# Patient Record
Sex: Female | Born: 1980 | Race: Black or African American | Hispanic: No | Smoking: Never smoker
Health system: Southern US, Community
[De-identification: ages and names within clinical notes are randomized; demographics above are authoritative.]

## PROBLEM LIST (undated history)

## (undated) ENCOUNTER — Inpatient Hospital Stay (HOSPITAL_COMMUNITY): Payer: Self-pay

## (undated) DIAGNOSIS — O24419 Gestational diabetes mellitus in pregnancy, unspecified control: Secondary | ICD-10-CM

## (undated) DIAGNOSIS — C801 Malignant (primary) neoplasm, unspecified: Secondary | ICD-10-CM

## (undated) DIAGNOSIS — R42 Dizziness and giddiness: Secondary | ICD-10-CM

## (undated) DIAGNOSIS — I10 Essential (primary) hypertension: Secondary | ICD-10-CM

## (undated) DIAGNOSIS — F419 Anxiety disorder, unspecified: Secondary | ICD-10-CM

## (undated) DIAGNOSIS — Z789 Other specified health status: Secondary | ICD-10-CM

## (undated) HISTORY — PX: NO PAST SURGERIES: SHX2092

---

## 1999-02-28 ENCOUNTER — Emergency Department (HOSPITAL_COMMUNITY): Admission: EM | Admit: 1999-02-28 | Discharge: 1999-02-28 | Payer: Self-pay

## 2002-02-27 ENCOUNTER — Emergency Department: Admission: EM | Admit: 2002-02-27 | Discharge: 2002-02-27 | Payer: Self-pay | Admitting: Emergency Medicine

## 2002-08-07 ENCOUNTER — Emergency Department (HOSPITAL_COMMUNITY): Admission: EM | Admit: 2002-08-07 | Discharge: 2002-08-07 | Payer: Self-pay | Admitting: Emergency Medicine

## 2002-08-19 ENCOUNTER — Emergency Department (HOSPITAL_COMMUNITY): Admission: EM | Admit: 2002-08-19 | Discharge: 2002-08-19 | Payer: Self-pay | Admitting: Emergency Medicine

## 2002-09-21 ENCOUNTER — Emergency Department (HOSPITAL_COMMUNITY): Admission: EM | Admit: 2002-09-21 | Discharge: 2002-09-22 | Payer: Self-pay | Admitting: Emergency Medicine

## 2002-09-23 ENCOUNTER — Emergency Department (HOSPITAL_COMMUNITY): Admission: EM | Admit: 2002-09-23 | Discharge: 2002-09-23 | Payer: Self-pay | Admitting: Emergency Medicine

## 2004-04-06 ENCOUNTER — Emergency Department (HOSPITAL_COMMUNITY): Admission: EM | Admit: 2004-04-06 | Discharge: 2004-04-06 | Payer: Self-pay | Admitting: Emergency Medicine

## 2005-04-20 ENCOUNTER — Emergency Department (HOSPITAL_COMMUNITY): Admission: EM | Admit: 2005-04-20 | Discharge: 2005-04-20 | Payer: Self-pay | Admitting: Emergency Medicine

## 2005-11-08 ENCOUNTER — Emergency Department (HOSPITAL_COMMUNITY): Admission: EM | Admit: 2005-11-08 | Discharge: 2005-11-08 | Payer: Self-pay | Admitting: Emergency Medicine

## 2006-05-16 ENCOUNTER — Emergency Department (HOSPITAL_COMMUNITY): Admission: EM | Admit: 2006-05-16 | Discharge: 2006-05-16 | Payer: Self-pay | Admitting: Emergency Medicine

## 2007-07-21 ENCOUNTER — Emergency Department (HOSPITAL_COMMUNITY): Admission: EM | Admit: 2007-07-21 | Discharge: 2007-07-21 | Payer: Self-pay | Admitting: Emergency Medicine

## 2007-12-14 ENCOUNTER — Emergency Department (HOSPITAL_COMMUNITY): Admission: EM | Admit: 2007-12-14 | Discharge: 2007-12-15 | Payer: Self-pay | Admitting: Emergency Medicine

## 2008-01-01 ENCOUNTER — Ambulatory Visit (HOSPITAL_COMMUNITY): Admission: RE | Admit: 2008-01-01 | Discharge: 2008-01-01 | Payer: Self-pay | Admitting: Family Medicine

## 2008-01-23 ENCOUNTER — Inpatient Hospital Stay (HOSPITAL_COMMUNITY): Admission: AD | Admit: 2008-01-23 | Discharge: 2008-01-23 | Payer: Self-pay | Admitting: Family Medicine

## 2008-01-28 ENCOUNTER — Ambulatory Visit (HOSPITAL_COMMUNITY): Admission: RE | Admit: 2008-01-28 | Discharge: 2008-01-28 | Payer: Self-pay | Admitting: Family Medicine

## 2008-01-30 ENCOUNTER — Inpatient Hospital Stay (HOSPITAL_COMMUNITY): Admission: AD | Admit: 2008-01-30 | Discharge: 2008-01-30 | Payer: Self-pay | Admitting: Gynecology

## 2008-02-12 ENCOUNTER — Ambulatory Visit (HOSPITAL_COMMUNITY): Admission: RE | Admit: 2008-02-12 | Discharge: 2008-02-12 | Payer: Self-pay | Admitting: Family Medicine

## 2008-03-03 ENCOUNTER — Inpatient Hospital Stay (HOSPITAL_COMMUNITY): Admission: AD | Admit: 2008-03-03 | Discharge: 2008-03-03 | Payer: Self-pay | Admitting: Obstetrics and Gynecology

## 2008-03-04 ENCOUNTER — Ambulatory Visit: Payer: Self-pay | Admitting: Family Medicine

## 2008-03-04 ENCOUNTER — Inpatient Hospital Stay (HOSPITAL_COMMUNITY): Admission: AD | Admit: 2008-03-04 | Discharge: 2008-03-04 | Payer: Self-pay | Admitting: Family Medicine

## 2008-03-14 ENCOUNTER — Ambulatory Visit (HOSPITAL_COMMUNITY): Admission: RE | Admit: 2008-03-14 | Discharge: 2008-03-14 | Payer: Self-pay | Admitting: Family Medicine

## 2008-04-21 ENCOUNTER — Observation Stay (HOSPITAL_COMMUNITY): Admission: AD | Admit: 2008-04-21 | Discharge: 2008-04-22 | Payer: Self-pay | Admitting: Obstetrics & Gynecology

## 2008-04-21 ENCOUNTER — Ambulatory Visit: Payer: Self-pay | Admitting: Advanced Practice Midwife

## 2008-04-24 ENCOUNTER — Inpatient Hospital Stay (HOSPITAL_COMMUNITY): Admission: AD | Admit: 2008-04-24 | Discharge: 2008-04-24 | Payer: Self-pay | Admitting: Obstetrics & Gynecology

## 2008-04-24 ENCOUNTER — Ambulatory Visit: Payer: Self-pay | Admitting: *Deleted

## 2008-05-03 ENCOUNTER — Ambulatory Visit: Payer: Self-pay | Admitting: Family Medicine

## 2008-05-03 ENCOUNTER — Inpatient Hospital Stay (HOSPITAL_COMMUNITY): Admission: AD | Admit: 2008-05-03 | Discharge: 2008-05-03 | Payer: Self-pay | Admitting: Family Medicine

## 2008-07-05 ENCOUNTER — Ambulatory Visit: Payer: Self-pay | Admitting: Family Medicine

## 2008-07-05 ENCOUNTER — Inpatient Hospital Stay (HOSPITAL_COMMUNITY): Admission: AD | Admit: 2008-07-05 | Discharge: 2008-07-07 | Payer: Self-pay | Admitting: Family Medicine

## 2009-04-20 ENCOUNTER — Ambulatory Visit (HOSPITAL_COMMUNITY): Admission: RE | Admit: 2009-04-20 | Discharge: 2009-04-20 | Payer: Self-pay | Admitting: Obstetrics

## 2009-05-14 ENCOUNTER — Inpatient Hospital Stay (HOSPITAL_COMMUNITY): Admission: AD | Admit: 2009-05-14 | Discharge: 2009-05-14 | Payer: Self-pay | Admitting: Obstetrics

## 2009-05-18 ENCOUNTER — Inpatient Hospital Stay (HOSPITAL_COMMUNITY): Admission: AD | Admit: 2009-05-18 | Discharge: 2009-05-20 | Payer: Self-pay | Admitting: Obstetrics

## 2009-05-18 ENCOUNTER — Encounter: Payer: Self-pay | Admitting: Obstetrics

## 2010-04-20 ENCOUNTER — Emergency Department (HOSPITAL_COMMUNITY): Admission: EM | Admit: 2010-04-20 | Discharge: 2010-04-20 | Payer: Self-pay | Admitting: Emergency Medicine

## 2010-07-23 ENCOUNTER — Emergency Department (HOSPITAL_COMMUNITY): Admission: EM | Admit: 2010-07-23 | Discharge: 2010-07-23 | Payer: Self-pay | Admitting: Emergency Medicine

## 2010-12-26 LAB — POCT PREGNANCY, URINE: Preg Test, Ur: NEGATIVE

## 2010-12-26 LAB — URINE MICROSCOPIC-ADD ON

## 2010-12-26 LAB — URINALYSIS, ROUTINE W REFLEX MICROSCOPIC
Nitrite: NEGATIVE
Protein, ur: NEGATIVE mg/dL
Specific Gravity, Urine: 1.021 (ref 1.005–1.030)
Urobilinogen, UA: 0.2 mg/dL (ref 0.0–1.0)

## 2010-12-26 LAB — WET PREP, GENITAL: Clue Cells Wet Prep HPF POC: NONE SEEN

## 2010-12-26 LAB — GC/CHLAMYDIA PROBE AMP, GENITAL: GC Probe Amp, Genital: NEGATIVE

## 2010-12-30 LAB — DIFFERENTIAL
Basophils Absolute: 0 10*3/uL (ref 0.0–0.1)
Eosinophils Relative: 2 % (ref 0–5)
Lymphocytes Relative: 28 % (ref 12–46)
Lymphs Abs: 1.4 10*3/uL (ref 0.7–4.0)
Neutro Abs: 3.1 10*3/uL (ref 1.7–7.7)
Neutrophils Relative %: 62 % (ref 43–77)

## 2010-12-30 LAB — BASIC METABOLIC PANEL
BUN: 10 mg/dL (ref 6–23)
Creatinine, Ser: 0.75 mg/dL (ref 0.4–1.2)
GFR calc Af Amer: >60 mL/min (ref >60)
GFR calc non Af Amer: >60 mL/min (ref >60)

## 2010-12-30 LAB — URINALYSIS, ROUTINE W REFLEX MICROSCOPIC
Glucose, UA: NEGATIVE mg/dL
Hgb urine dipstick: NEGATIVE
Ketones, ur: NEGATIVE mg/dL
Protein, ur: NEGATIVE mg/dL
Urobilinogen, UA: 1 mg/dL (ref 0.0–1.0)

## 2010-12-30 LAB — CBC
Platelets: 206 10*3/uL (ref 150–400)
RBC: 5.03 MIL/uL (ref 3.87–5.11)
RDW: 14.9 % (ref 11.5–15.5)
WBC: 5 10*3/uL (ref 4.0–10.5)

## 2010-12-30 LAB — POCT PREGNANCY, URINE: Preg Test, Ur: NEGATIVE

## 2010-12-30 LAB — URINE MICROSCOPIC-ADD ON

## 2010-12-30 LAB — WET PREP, GENITAL
Clue Cells Wet Prep HPF POC: NONE SEEN
Yeast Wet Prep HPF POC: NONE SEEN

## 2010-12-30 LAB — GC/CHLAMYDIA PROBE AMP, GENITAL
Chlamydia, DNA Probe: NEGATIVE
GC Probe Amp, Genital: NEGATIVE

## 2011-01-19 LAB — URINALYSIS, ROUTINE W REFLEX MICROSCOPIC
Glucose, UA: NEGATIVE mg/dL
Ketones, ur: 40 mg/dL — AB
Protein, ur: NEGATIVE mg/dL
Urobilinogen, UA: 1 mg/dL (ref 0.0–1.0)

## 2011-01-19 LAB — CBC
HCT: 37.2 % (ref 36.0–46.0)
MCHC: 33.5 g/dL (ref 30.0–36.0)
Platelets: 176 10*3/uL (ref 150–400)
RBC: 4.71 MIL/uL (ref 3.87–5.11)
RDW: 14.4 % (ref 11.5–15.5)
RDW: 14.9 % (ref 11.5–15.5)

## 2011-01-19 LAB — RPR: RPR Ser Ql: NONREACTIVE

## 2011-02-26 NOTE — Discharge Summary (Signed)
Melanie Castillo, ROSELLO             ACCOUNT NO.:  [id number]   MEDICAL RECORD NO.:  [id number]          PATIENT TYPE:  INP   LOCATION:  9155                          FACILITY:  [facility]   PHYSICIAN:  Norton Blizzard, MD    DATE OF BIRTH:  12-06-1980   DATE OF ADMISSION:  04/21/2008  DATE OF DISCHARGE:                               DISCHARGE SUMMARY   ADMITTING DIAGNOSIS:  Fetal tachycardia in the 200 range, 28 weeks 2  days gestation.  False labor   LABORATORY DATA:  None significant.   TREATMENT:  The patient was given IV fluids, O2 via non-rebreather mask  for fetal tachycardia in the 190-200 range and MAU for a couple of  hours.  The heart rate did start to settle down after the interventions,  however, we decided to observe her overnight just to keep an eye on the  baby.  The baby never had any more periods of tachycardia, baseline  ranges 150-160 with average LTV, occasional very mild variable, which is  normal for a 28-week baby and 10 x 10 axils.  She never did run a fever  more than 99.2.  The patient no longer complains of any other symptoms,  and had no signs or symptoms of preterm labor.   PLAN:  She is discharged home.  She is to return to the hospital or the  clinic for flu testing if she gets a fever over 100.4.  She verbalizes  an understanding of this plan.      Jacklyn Shell, C.N.M.      Norton Blizzard, MD  Electronically Signed    FC/MEDQ  D:  04/22/2008  T:  04/22/2008  Job:  [id number]

## 2011-07-09 LAB — WET PREP, GENITAL
Clue Cells Wet Prep HPF POC: NONE SEEN
Trich, Wet Prep: NONE SEEN
Trich, Wet Prep: NONE SEEN
Yeast Wet Prep HPF POC: NONE SEEN
Yeast Wet Prep HPF POC: NONE SEEN

## 2011-07-09 LAB — URINE MICROSCOPIC-ADD ON

## 2011-07-09 LAB — URINALYSIS, ROUTINE W REFLEX MICROSCOPIC
Glucose, UA: NEGATIVE
Hgb urine dipstick: NEGATIVE
Ketones, ur: NEGATIVE
Protein, ur: NEGATIVE

## 2011-07-09 LAB — GC/CHLAMYDIA PROBE AMP, GENITAL: GC Probe Amp, Genital: NEGATIVE

## 2011-07-10 LAB — URINALYSIS, ROUTINE W REFLEX MICROSCOPIC
Glucose, UA: NEGATIVE
Hgb urine dipstick: NEGATIVE
Protein, ur: NEGATIVE
Specific Gravity, Urine: 1.01
pH: 5

## 2011-07-10 LAB — COMPREHENSIVE METABOLIC PANEL
ALT: 35
AST: 28
Albumin: 2.8 — ABNORMAL LOW
CO2: 22
Chloride: 106
Creatinine, Ser: 0.63
GFR calc Af Amer: >60
GFR calc non Af Amer: >60
Potassium: 3.5
Sodium: 135
Total Bilirubin: 0.3

## 2011-07-11 LAB — URINALYSIS, ROUTINE W REFLEX MICROSCOPIC
Bilirubin Urine: NEGATIVE
Hgb urine dipstick: NEGATIVE
Ketones, ur: NEGATIVE
Ketones, ur: NEGATIVE
Nitrite: NEGATIVE
Protein, ur: NEGATIVE
Specific Gravity, Urine: <1.005 — ABNORMAL LOW
Urobilinogen, UA: 0.2
pH: 6.5

## 2011-07-11 LAB — RAPID STREP SCREEN (MED CTR MEBANE ONLY): Streptococcus, Group A Screen (Direct): NEGATIVE

## 2011-07-11 LAB — STREP B DNA PROBE: Strep Group B Ag: POSITIVE

## 2011-07-12 LAB — WET PREP, GENITAL

## 2011-07-12 LAB — URINALYSIS, ROUTINE W REFLEX MICROSCOPIC
Bilirubin Urine: NEGATIVE
Ketones, ur: NEGATIVE
Nitrite: NEGATIVE
Urobilinogen, UA: 0.2

## 2011-07-15 LAB — CBC
Hemoglobin: 13.2
RBC: 5.1
WBC: 10.7 — ABNORMAL HIGH

## 2011-07-25 LAB — URINALYSIS, ROUTINE W REFLEX MICROSCOPIC
Glucose, UA: NEGATIVE
Ketones, ur: NEGATIVE
pH: 6.5

## 2011-07-25 LAB — POCT PREGNANCY, URINE
Operator id: 272551
Preg Test, Ur: NEGATIVE

## 2011-07-25 LAB — GC/CHLAMYDIA PROBE AMP, GENITAL
Chlamydia, DNA Probe: NEGATIVE
GC Probe Amp, Genital: NEGATIVE

## 2011-07-25 LAB — URINE MICROSCOPIC-ADD ON

## 2011-07-25 LAB — WET PREP, GENITAL: Yeast Wet Prep HPF POC: NONE SEEN

## 2011-08-11 ENCOUNTER — Emergency Department (HOSPITAL_COMMUNITY)
Admission: EM | Admit: 2011-08-11 | Discharge: 2011-08-11 | Disposition: A | Discharge status: Home / Self Care | Payer: Medicaid Other | Attending: Emergency Medicine | Admitting: Emergency Medicine

## 2011-08-11 DIAGNOSIS — L851 Acquired keratosis [keratoderma] palmaris et plantaris: Secondary | ICD-10-CM | POA: Insufficient documentation

## 2011-08-11 DIAGNOSIS — M79609 Pain in unspecified limb: Secondary | ICD-10-CM | POA: Insufficient documentation

## 2011-08-11 DIAGNOSIS — R21 Rash and other nonspecific skin eruption: Secondary | ICD-10-CM | POA: Insufficient documentation

## 2011-08-11 DIAGNOSIS — R109 Unspecified abdominal pain: Secondary | ICD-10-CM | POA: Insufficient documentation

## 2011-08-11 DIAGNOSIS — R110 Nausea: Secondary | ICD-10-CM | POA: Insufficient documentation

## 2011-08-11 LAB — URINALYSIS, ROUTINE W REFLEX MICROSCOPIC
Bilirubin Urine: NEGATIVE
Hgb urine dipstick: NEGATIVE
Ketones, ur: NEGATIVE mg/dL
Protein, ur: NEGATIVE mg/dL
Urobilinogen, UA: 0.2 mg/dL (ref 0.0–1.0)

## 2011-08-11 LAB — POCT I-STAT, CHEM 8
Calcium, Ion: 1.14 mmol/L (ref 1.12–1.32)
Chloride: 105 mEq/L (ref 96–112)
HCT: 45 % (ref 36.0–46.0)
TCO2: 25 mmol/L (ref 0–100)

## 2011-08-11 LAB — ETHANOL: Alcohol, Ethyl (B): 18 mg/dL — ABNORMAL HIGH (ref 0–11)

## 2011-08-11 LAB — URINE MICROSCOPIC-ADD ON

## 2012-02-09 ENCOUNTER — Emergency Department (HOSPITAL_COMMUNITY)
Admission: EM | Admit: 2012-02-09 | Discharge: 2012-02-09 | Disposition: A | Discharge status: Home / Self Care | Payer: Medicaid Other | Attending: Emergency Medicine | Admitting: Emergency Medicine

## 2012-02-09 ENCOUNTER — Encounter (HOSPITAL_COMMUNITY): Payer: Self-pay | Admitting: *Deleted

## 2012-02-09 DIAGNOSIS — R05 Cough: Secondary | ICD-10-CM | POA: Insufficient documentation

## 2012-02-09 DIAGNOSIS — J029 Acute pharyngitis, unspecified: Secondary | ICD-10-CM

## 2012-02-09 DIAGNOSIS — R070 Pain in throat: Secondary | ICD-10-CM | POA: Insufficient documentation

## 2012-02-09 DIAGNOSIS — R059 Cough, unspecified: Secondary | ICD-10-CM | POA: Insufficient documentation

## 2012-02-09 MED ORDER — IBUPROFEN 200 MG PO TABS: 600.0000 mg | ORAL_TABLET | Freq: Once | ORAL | Status: DC

## 2012-02-09 NOTE — ED Notes (Signed)
Reports having sore throat for several days, severe pain when swallowing, airway is intact, resp e/u

## 2012-02-09 NOTE — ED Provider Notes (Signed)
History     CSN: [id number]  Arrival date & time 02/09/12  1025   First MD Initiated Contact with Patient 02/09/12 1101      Chief Complaint  Patient presents with  . Sore Throat    (Consider location/radiation/quality/duration/timing/severity/associated sxs/prior treatment) Patient is a 31 y.o. female presenting with pharyngitis. The history is provided by the patient.  Sore Throat Pertinent negatives include no chest pain, no abdominal pain, no headaches and no shortness of breath.  pt c/o sore throat for past 3-4 days. Gradual onset. Constant. Dull. Worse w swallowing. No trouble breathing, also notes non productive cough, no sob. No sinus drainage or pain. No headache. No abd pain. No nvd. No known ill contacts. No fever or chills. Sore throat bil, no unilateral throat or neck pain or swelling. No fb sensation.   History reviewed. No pertinent past medical history.  History reviewed. No pertinent past surgical history.  History reviewed. No pertinent family history.  History  Substance Use Topics  . Smoking status: Not on file  . Smokeless tobacco: Not on file  . Alcohol Use: No    OB History    Grav Para Term Preterm Abortions TAB SAB Ect Mult Living                  Review of Systems  Constitutional: Negative for fever and chills.  HENT: Negative for congestion and rhinorrhea.   Eyes: Negative for discharge and redness.  Respiratory: Negative for shortness of breath.   Cardiovascular: Negative for chest pain.  Gastrointestinal: Negative for nausea, vomiting, abdominal pain and diarrhea.  Skin: Negative for rash.  Neurological: Negative for headaches.    Allergies  Review of patient's allergies indicates no known allergies.  Home Medications  No current outpatient prescriptions on file.  BP 124/75  Pulse 78  Temp(Src) 98.5 F (36.9 C) (Oral)  Resp 18  SpO2 98%  LMP 01/09/2012  Physical Exam  Nursing note and vitals reviewed. Constitutional: She  is oriented to person, place, and time. She appears well-developed and well-nourished. No distress.  HENT:  Head: Atraumatic.  Right Ear: External ear normal.  Left Ear: External ear normal.       Pharynx mildly erythematous. No exudate. No asymmetric swelling or abscess. No trismus.   Eyes: Conjunctivae are normal. Right eye exhibits no discharge. Left eye exhibits no discharge. No scleral icterus.  Neck: Normal range of motion. Neck supple. No tracheal deviation present.       No stiffness or rigidity  Cardiovascular: Normal rate, regular rhythm, normal heart sounds and intact distal pulses.   No murmur heard. Pulmonary/Chest: Effort normal and breath sounds normal. No respiratory distress.  Abdominal: Soft. Normal appearance and bowel sounds are normal. She exhibits no distension. There is no tenderness.       No hsm.   Musculoskeletal: She exhibits no edema.  Neurological: She is alert and oriented to person, place, and time.  Skin: Skin is warm and dry. No rash noted.  Psychiatric: She has a normal mood and affect.    ED Course  Procedures (including critical care time)   Labs Reviewed  RAPID STREP SCREEN    Results for orders placed during the hospital encounter of 02/09/12  RAPID STREP SCREEN      Component Value Range   Streptococcus, Group A Screen (Direct) NEGATIVE  NEGATIVE        MDM  Motrin po.  Strep screen.   Strep neg. Explained to pt that  symptoms,exam most c/w viral illness.           Suzi Roots, MD 02/09/12 1113

## 2012-02-09 NOTE — Discharge Instructions (Signed)
Your strep test is negative, indicating symptoms likely caused by a viral upper respiratory illness which should run its course and get better in the next few days. Rest. Drink plenty of fluids. Take tylenol and/or motrin as need. May use throat spray or lozenges as need for symptom relief. Follow up with primary care doctor in 1 week if symptoms fail to improve/resolve. Return to ER if worse, unable to swallow, trouble breathing, other concern.       Sore Throat Sore throats may be caused by bacteria and viruses. They may also be caused by:  Smoking.   Pollution.   Allergies.  If a sore throat is due to strep infection (a bacterial infection), you may need:  A throat swab.   A culture test to verify the strep infection.  You will need one of these:  An antibiotic shot.   Oral medicine for a full 10 days.  Strep infection is very contagious. A doctor should check any close contacts who have a sore throat or fever. A sore throat caused by a virus infection will usually last only 3-4 days. Antibiotics will not treat a viral sore throat.  Infectious mononucleosis (a viral disease), however, can cause a sore throat that lasts for up to 3 weeks. Mononucleosis can be diagnosed with blood tests. You must have been sick for at least 1 week in order for the test to give accurate results. HOME CARE INSTRUCTIONS   To treat a sore throat, take mild pain medicine.   Increase your fluids.   Eat a soft diet.   Do not smoke.   Gargling with warm water or salt water (1 tsp. salt in 8 oz. water) can be helpful.   Try throat sprays or lozenges or sucking on hard candy to ease the symptoms.  Call your doctor if your sore throat lasts longer than 1 week.  SEEK IMMEDIATE MEDICAL CARE IF:  You have difficulty breathing.   You have increased swelling in the throat.   You have pain so severe that you are unable to swallow fluids or your saliva.   You have a severe headache, a high fever,  vomiting, or a red rash.  Document Released: 11/07/2004 Document Revised: 09/19/2011 Document Reviewed: 09/17/2007 [facility] Patient Information 2012 [place], [place].

## 2013-03-16 ENCOUNTER — Encounter: Payer: Self-pay | Admitting: Obstetrics

## 2013-03-16 ENCOUNTER — Ambulatory Visit (INDEPENDENT_AMBULATORY_CARE_PROVIDER_SITE_OTHER): Payer: Medicaid Other | Admitting: Obstetrics

## 2013-03-16 ENCOUNTER — Ambulatory Visit: Payer: Medicaid Other | Admitting: *Deleted

## 2013-03-16 VITALS — BP 120/80 | HR 75 | Temp 98.4°F | Ht 64.0 in | Wt 231.0 lb

## 2013-03-16 DIAGNOSIS — Z30431 Encounter for routine checking of intrauterine contraceptive device: Secondary | ICD-10-CM

## 2013-03-16 DIAGNOSIS — Z3049 Encounter for surveillance of other contraceptives: Secondary | ICD-10-CM

## 2013-03-16 DIAGNOSIS — Z3009 Encounter for other general counseling and advice on contraception: Secondary | ICD-10-CM

## 2013-03-16 DIAGNOSIS — Z30432 Encounter for removal of intrauterine contraceptive device: Secondary | ICD-10-CM

## 2013-03-16 MED ORDER — MEDROXYPROGESTERONE ACETATE 150 MG/ML IM SUSP
150.0000 mg | INTRAMUSCULAR | Status: AC
  Administered 2013-03-16 – 2013-09-29 (×3): 150 mg via INTRAMUSCULAR

## 2013-03-16 MED ORDER — MEDROXYPROGESTERONE ACETATE 150 MG/ML IM SUSP: 150.0000 mg | INTRAMUSCULAR | Status: DC

## 2013-03-16 NOTE — Progress Notes (Signed)
Subjective:    Melanie Castillo is a 32 y.o. female who presents for contraception counseling. The patient has no complaints today and is requesting IUD removal. The patient is not currently sexually active. Pertinent past medical history: none.  Menstrual History:  Menarche age: 32  No LMP recorded. Patient is not currently having periods (Reason: IUD).    The following portions of the patient's history were reviewed and updated as appropriate: allergies, current medications, past family history, past medical history, past social history, past surgical history and problem list.  Review of Systems Pertinent items are noted in HPI.   Objective:    General appearance: alert and no distress Abdomen: normal findings: soft, non-tender Pelvic: cervix normal in appearance, external genitalia normal, no adnexal masses or tenderness, no cervical motion tenderness, uterus normal size, shape, and consistency and vagina normal without discharge  IUD string grasped and IUD removed, intact.  Assessment:    32 y.o., discontinuing IUD, no contraindications.   IUD removed.  Wants to start Depo Provera for contraception.  Plan:    All questions answered. Contraception: Depo-Provera injections. Annual exam next visit.  Depo Provera Rx.

## 2013-03-16 NOTE — Progress Notes (Signed)
See office visit from 03/16/13.

## 2013-03-17 ENCOUNTER — Encounter: Payer: Self-pay | Admitting: Obstetrics

## 2013-05-18 ENCOUNTER — Ambulatory Visit: Payer: Medicaid Other | Admitting: Obstetrics

## 2013-06-07 ENCOUNTER — Other Ambulatory Visit: Payer: Self-pay | Admitting: Obstetrics

## 2013-06-07 ENCOUNTER — Ambulatory Visit: Payer: Medicaid Other

## 2013-06-08 ENCOUNTER — Other Ambulatory Visit: Payer: Self-pay | Admitting: *Deleted

## 2013-06-08 DIAGNOSIS — Z3009 Encounter for other general counseling and advice on contraception: Secondary | ICD-10-CM

## 2013-06-08 MED ORDER — MEDROXYPROGESTERONE ACETATE 150 MG/ML IM SUSP: 150.0000 mg | INTRAMUSCULAR | Status: DC

## 2013-06-16 ENCOUNTER — Other Ambulatory Visit: Payer: BC Managed Care – PPO

## 2013-06-16 ENCOUNTER — Other Ambulatory Visit: Payer: Medicaid Other

## 2013-06-17 ENCOUNTER — Other Ambulatory Visit: Payer: Medicaid Other

## 2013-06-17 ENCOUNTER — Other Ambulatory Visit (INDEPENDENT_AMBULATORY_CARE_PROVIDER_SITE_OTHER): Payer: Medicaid Other

## 2013-06-17 VITALS — BP 123/85 | HR 72 | Temp 98.2°F | Wt 230.0 lb

## 2013-06-17 DIAGNOSIS — Z3202 Encounter for pregnancy test, result negative: Secondary | ICD-10-CM

## 2013-06-17 DIAGNOSIS — Z3049 Encounter for surveillance of other contraceptives: Secondary | ICD-10-CM

## 2013-06-17 LAB — POCT URINE PREGNANCY: Preg Test, Ur: NEGATIVE

## 2013-06-17 NOTE — Progress Notes (Signed)
Pt in office today pregnancy test to restart her Depo injection. Pregnancy test in office today is negative.

## 2013-06-30 ENCOUNTER — Encounter: Payer: Self-pay | Admitting: Obstetrics

## 2013-06-30 ENCOUNTER — Ambulatory Visit (INDEPENDENT_AMBULATORY_CARE_PROVIDER_SITE_OTHER): Payer: Medicaid Other | Admitting: Obstetrics

## 2013-06-30 VITALS — BP 120/81 | HR 87 | Temp 98.9°F | Ht 64.0 in | Wt 231.0 lb

## 2013-06-30 DIAGNOSIS — Z0000 Encounter for general adult medical examination without abnormal findings: Secondary | ICD-10-CM

## 2013-06-30 DIAGNOSIS — Z01419 Encounter for gynecological examination (general) (routine) without abnormal findings: Secondary | ICD-10-CM

## 2013-06-30 DIAGNOSIS — Z3202 Encounter for pregnancy test, result negative: Secondary | ICD-10-CM

## 2013-06-30 DIAGNOSIS — IMO0001 Reserved for inherently not codable concepts without codable children: Secondary | ICD-10-CM

## 2013-06-30 DIAGNOSIS — Z113 Encounter for screening for infections with a predominantly sexual mode of transmission: Secondary | ICD-10-CM

## 2013-06-30 LAB — POCT URINE PREGNANCY: Preg Test, Ur: NEGATIVE

## 2013-06-30 NOTE — Progress Notes (Signed)
Subjective:     Melanie Castillo is a 32 y.o. female here for a routine exam.  Current complaints: patient is in the office for annual exam and Depo. Patient states she is late for her injection, but has not been active for over 1 month..  Personal health questionnaire reviewed: no.   Gynecologic History No LMP recorded. Patient has had an injection. Contraception: abstinence and Depo-Provera injections Last Pap: over 1 year. Results were: abnormal   Obstetric History OB History  No data available     The following portions of the patient's history were reviewed and updated as appropriate: allergies, current medications, past family history, past medical history, past social history, past surgical history and problem list.  Review of Systems Pertinent items are noted in HPI.    Objective:    General appearance: alert and no distress Breasts: normal appearance, no masses or tenderness Abdomen: normal findings: soft, non-tender Pelvic: cervix normal in appearance, external genitalia normal, no adnexal masses or tenderness, no cervical motion tenderness, rectovaginal septum normal, uterus normal size, shape, and consistency and vagina normal without discharge    Assessment:    Healthy female exam.    Plan:    Education reviewed: safe sex/STD prevention and self breast exams.   F/U 1 year.

## 2013-07-01 ENCOUNTER — Encounter: Payer: Self-pay | Admitting: Obstetrics

## 2013-07-02 LAB — WET PREP BY MOLECULAR PROBE
Candida species: NEGATIVE
Trichomonas vaginosis: NEGATIVE

## 2013-08-12 ENCOUNTER — Encounter (HOSPITAL_COMMUNITY): Payer: Self-pay | Admitting: Emergency Medicine

## 2013-08-12 ENCOUNTER — Emergency Department (HOSPITAL_COMMUNITY)
Admission: EM | Admit: 2013-08-12 | Discharge: 2013-08-12 | Disposition: A | Discharge status: Home / Self Care | Payer: Medicaid Other | Attending: Emergency Medicine | Admitting: Emergency Medicine

## 2013-08-12 DIAGNOSIS — L0201 Cutaneous abscess of face: Secondary | ICD-10-CM | POA: Insufficient documentation

## 2013-08-12 DIAGNOSIS — J340 Abscess, furuncle and carbuncle of nose: Secondary | ICD-10-CM

## 2013-08-12 DIAGNOSIS — L03211 Cellulitis of face: Secondary | ICD-10-CM | POA: Insufficient documentation

## 2013-08-12 MED ORDER — CEPHALEXIN 500 MG PO CAPS: 500.0000 mg | ORAL_CAPSULE | Freq: Four times a day (QID) | ORAL | Status: DC

## 2013-08-12 MED ORDER — SULFAMETHOXAZOLE-TRIMETHOPRIM 800-160 MG PO TABS: 1.0000 | ORAL_TABLET | Freq: Two times a day (BID) | ORAL | Status: DC

## 2013-08-12 NOTE — ED Provider Notes (Signed)
CSN: [id number]     Arrival date & time 08/12/13  1440 History  This chart was scribed for non-physician practitioner working with Flint Melter, MD by Ronal Fear, ED scribe. This patient was seen in room TR08C/TR08C and the patient's care was started at 4:35 PM.    No chief complaint on file.   Patient is a 32 y.o. female presenting with animal bite. The history is provided by the patient. No language interpreter was used.  Animal Bite Contact animal:  Insect Location:  Face Facial injury location:  Nose Time since incident:  5 days Pain details:    Quality:  Sharp   Severity:  Mild   Timing:  Constant   Progression:  Partially resolved Incident location:  Home Tetanus status:  Unknown Relieved by:  Nothing Worsened by:  Nothing tried Ineffective treatments:  Heat Associated symptoms: swelling   Associated symptoms: no fever and no rash   HPI Comments: Melanie Castillo is a 32 y.o. female who presents to the Emergency Department complaining that she awoke with a lesion to the tip of her nose that is painful to touch which presented as 3 white heads at the tip of her nose that has now partially resolved into a hard area with redness onset Saturday evening. She also complains of swollen lymph nodes in her neck and chin onset tuesday.  She does not appear to be in any acute distress with no other complaints. She denies fever, chills, headache, neck pain, neck stiffness, chest pain, shortness of breath, abdominal pain, nausea, vomiting diarrhea, weakness, dizziness, syncope. She has taken nothing for the pain. She has attempted hot compresses without relief. Nothing seems to make the symptoms better or worse.  History reviewed. No pertinent past medical history. Past Surgical History  Procedure Laterality Date  . No past surgeries     Family History  Problem Relation Age of Onset  . Diabetes    . Hypertension    . Colon cancer Father    History  Substance Use Topics  .  Smoking status: Never Smoker   . Smokeless tobacco: Never Used  . Alcohol Use: Yes     Comment: occasional   OB History   Grav Para Term Preterm Abortions TAB SAB Ect Mult Living                 Review of Systems  Constitutional: Negative for fever and chills.  HENT: Negative for congestion and sore throat.   Respiratory: Negative for cough.   Gastrointestinal: Negative for nausea, vomiting and diarrhea.  Skin: Positive for color change and wound. Negative for rash.  Allergic/Immunologic: Negative for immunocompromised state.  Hematological: Does not bruise/bleed easily.  Psychiatric/Behavioral: The patient is not nervous/anxious.   All other systems reviewed and are negative.    Allergies  Review of patient's allergies indicates no known allergies.  Home Medications   Current Outpatient Rx  Name  Route  Sig  Dispense  Refill  . medroxyPROGESTERone (DEPO-PROVERA) 150 MG/ML injection   Intramuscular   Inject 1 mL (150 mg total) into the muscle every 3 (three) months.   1 mL   4   . cephALEXin (KEFLEX) 500 MG capsule   Oral   Take 1 capsule (500 mg total) by mouth 4 (four) times daily.   40 capsule   0   . sulfamethoxazole-trimethoprim (BACTRIM DS,SEPTRA DS) 800-160 MG per tablet   Oral   Take 1 tablet by mouth 2 (two) times daily. One  po bid x 7 days   14 tablet   0    BP 142/90  Pulse 96  Temp(Src) 98.5 F (36.9 C) (Oral)  Resp 18  SpO2 99% Physical Exam  Nursing note and vitals reviewed. Constitutional: She is oriented to person, place, and time. She appears well-developed and well-nourished. No distress.  HENT:  Head: Normocephalic and atraumatic.  Right Ear: Tympanic membrane, external ear and ear canal normal.  Left Ear: Tympanic membrane, external ear and ear canal normal.  Nose:    Mouth/Throat: Uvula is midline, oropharynx is clear and moist and mucous membranes are normal. Mucous membranes are not dry. Normal dentition. No dental abscesses,  uvula swelling or dental caries. No oropharyngeal exudate, posterior oropharyngeal edema, posterior oropharyngeal erythema or tonsillar abscesses.  No visible dental caries No pain to palpation or induration the floor of the mouth No dental abscesses visible  2 x 2 centimeter area of erythema and induration to the tip of the nose with apparent serous drainage  Eyes: Conjunctivae are normal. No scleral icterus.  Neck: Normal range of motion and full passive range of motion without pain. No muscular tenderness present. No rigidity. Normal range of motion present.  Tender, mobile, large and discrete tonsilar, submandibular and submental lymphadenopathy. non tender mildly enlarged cervical lymphadenopathy. No deep cervical lymphadenopathy    Full range of motion of the neck, no nuchal rigidity, no neck pain  Cardiovascular: Normal rate, regular rhythm, S1 normal, S2 normal, normal heart sounds and intact distal pulses.   Pulses:      Radial pulses are 2+ on the right side, and 2+ on the left side.  No tachycardia  Pulmonary/Chest: Effort normal and breath sounds normal. She has no decreased breath sounds. She has no wheezes. She has no rhonchi. She has no rales.  Abdominal: Soft. She exhibits no distension. There is no tenderness.  Lymphadenopathy:       Head (right side): Submental, submandibular and tonsillar adenopathy present. No preauricular, no posterior auricular and no occipital adenopathy present.       Head (left side): Submental, submandibular and tonsillar adenopathy present. No preauricular and no posterior auricular adenopathy present.    She has cervical adenopathy.       Right cervical: Superficial cervical adenopathy present. No deep cervical and no posterior cervical adenopathy present.      Left cervical: Superficial cervical adenopathy present. No deep cervical and no posterior cervical adenopathy present.  Neurological: She is alert and oriented to person, place, and time.   Skin: Skin is warm and dry. She is not diaphoretic. There is erythema.  2/2 cm area at the tip of nose with erythema and induration. Scabbed and no drainage.    Psychiatric: She has a normal mood and affect.    ED Course  Procedures (including critical care time) DIAGNOSTIC STUDIES: Oxygen Saturation is 99% on RA, normal by my interpretation.    COORDINATION OF CARE:   4:42 PM- Pt advised of plan for treatment including antibiotics and pt agrees.  Labs Review Labs Reviewed - No data to display Imaging Review No results found.  EKG Interpretation   None       MDM   1. Cellulitis of nose, external     Melanie Castillo presents with cellulitis.  Pt is without risk factors for HIV; no recent use of steroids or other immunosuppressive medications; no Hx of diabetes.  Pt is without gross abscess for which I&D would be possible. Pt encouraged to  return if redness begins to streak, extends and/or fever or nausea/vomiting develop.  Pt is alert, oriented, NAD, afebrile, non tachycardic, nonseptic and nontoxic appearing.  Pt to be d/c on oral antibiotics with strict f/u instructions.    It has been determined that no acute conditions requiring further emergency intervention are present at this time. The patient/guardian have been advised of the diagnosis and plan. We have discussed signs and symptoms that warrant return to the ED, such as changes or worsening in symptoms.   Vital signs are stable at discharge.   BP 142/90  Pulse 96  Temp(Src) 98.5 F (36.9 C) (Oral)  Resp 18  SpO2 99%  Patient/guardian has voiced understanding and agreed to follow-up with the PCP or specialist.    I personally performed the services described in this documentation, which was scribed in my presence. The recorded information has been reviewed and is accurate.    Dahlia Client Kammie Scioli, PA-C 08/12/13 1757

## 2013-08-12 NOTE — Discharge Instructions (Signed)
1. Medications: , Keflex, Bactrim, usual home medications 2. Treatment: rest, drink plenty of fluids, warm compresses several times per day 3. Follow Up: Please followup with your primary doctor for discussion of your diagnoses and further evaluation after today's visit; if you do not have a primary care doctor use the resource guide provided to find one;    Cellulitis Cellulitis is an infection of the skin and the tissue beneath it. The infected area is usually red and tender. Cellulitis occurs most often in the arms and lower legs.  CAUSES  Cellulitis is caused by bacteria that enter the skin through cracks or cuts in the skin. The most common types of bacteria that cause cellulitis are Staphylococcus and Streptococcus. SYMPTOMS   Redness and warmth.  Swelling.  Tenderness or pain.  Fever. DIAGNOSIS  Your caregiver can usually determine what is wrong based on a physical exam. Blood tests may also be done. TREATMENT  Treatment usually involves taking an antibiotic medicine. HOME CARE INSTRUCTIONS   Take your antibiotics as directed. Finish them even if you start to feel better.  Keep the infected arm or leg elevated to reduce swelling.  Apply a warm cloth to the affected area up to 4 times per day to relieve pain.  Only take over-the-counter or prescription medicines for pain, discomfort, or fever as directed by your caregiver.  Keep all follow-up appointments as directed by your caregiver. SEEK MEDICAL CARE IF:   You notice red streaks coming from the infected area.  Your red area gets larger or turns dark in color.  Your bone or joint underneath the infected area becomes painful after the skin has healed.  Your infection returns in the same area or another area.  You notice a swollen bump in the infected area.  You develop new symptoms. SEEK IMMEDIATE MEDICAL CARE IF:   You have a fever.  You feel very sleepy.  You develop vomiting or diarrhea.  You have a  general ill feeling (malaise) with muscle aches and pains. MAKE SURE YOU:   Understand these instructions.  Will watch your condition.  Will get help right away if you are not doing well or get worse. Document Released: 07/10/2005 Document Revised: 03/31/2012 Document Reviewed: 12/16/2011 [facility] Patient Information 2014 [place], [place].   RESOURCE GUIDE  Chronic Pain Problems: Contact [facility]  [phone] Patients need to be referred by their primary care doctor.  Insufficient Money for Medicine: Contact United Way:  call [phone]  No Primary Care Doctor: - Call Health Connect  [phone] - can help you locate a primary care doctor that  accepts your insurance, provides certain services, etc. - Physician Referral [phone]  Agencies that provide inexpensive medical care: - [facility]  [phone] - [facility]  [phone] - [facility]  [phone] - Women's Clinic  [phone] - Planned Parenthood  [phone] [facility]  [phone]  Medicaid-accepting [facility] Providers: - [facility]- [address]  [phone], Mon-Fri 9am-7pm, Sat 9am-1pm - [facility]- [address] [place], Suite [place]  [phone] - [facility]- [address] [place]  [phone] [facility]- [address]  [phone] - Renaye Rakers- [address] [place], [address], [phone]  Only accepts [place] patients after they have their name  applied to their card  Self Pay (no insurance) in [place]: -  Sickle Cell Patients - [facility]  [address] [place], [phone] - [facility]- [address] [place]  [phone]       Patrcia Dolly [facility] Care [place]- [address]       -     [facility]- see information above (Speak to [employer] if you do not  have insurance)       -  [facility]- [address],  [phone]       -  [facility]- [address] [phone]       -  Dr Julio Sicks-  [address], [place], [phone]       -  [facility] - [address], [phone]       -  [facility]- [address] [phone], also [address] [phone]       -     [facility]- [address] [place], [phone], 1st & 3rd Saturday         every month, 10am-1pm  -     [facility]   [address] [place], [place].   Phone:  [phone], Fax:  [phone]. Hours of Operation:  9 am - 6 pm, M-F.  -     [facility]   [address], [place]   Phone: [phone], Fax: [phone]. Hours of Operation:  8:30 am - 5:30 pm, M-F.  [facility] [address] [place], [place] [address] [phone]  [facility] [address] [place], [place] [address] [phone]  1) Find a Doctor and Pay Out of Pocket Although you won't have to find out who is covered by your insurance plan, it is a good idea to ask around and get recommendations. You will then need to call the office and see if the doctor you have chosen will accept you as a new patient and what types of options they offer for patients who are self-pay. Some doctors offer discounts or will set up payment plans for their patients who do not have insurance, but you will need to ask so you aren't surprised when you get to your appointment.  2) Contact Your Local Health Department Not all health departments have doctors that can see patients for sick visits, but many do, so it is worth a call to see if yours does. If you don't know where your local health department is, you can check in your phone book. The CDC also has a tool to help you locate your state's health department, and many state websites also have listings  of all of their local health departments.  3) Find a Walk-in Clinic If your illness is not likely to be very severe or complicated, you may want to try a walk in clinic. These are popping up all over the country in pharmacies, drugstores, and shopping centers. They're usually staffed by nurse practitioners or physician assistants that have been trained to treat common illnesses and complaints. They're usually fairly quick and inexpensive. However, if you have serious medical issues or chronic medical problems, these are probably not your best option  STD Testing - [facility] Department of [facility], STD Clinic, [address] [place], phone [phone] or [phone].  Monday - Friday, call for an appointment. -  [facility] Department of [employer], STD Clinic, [address], [place], phone [phone] or [phone].  Monday - Friday, call for an appointment.  Abuse/Neglect: [facility] Child Abuse Hotline 574-524-5345 [facility] Child Abuse Hotline 716-098-4370 (After Hours)  Emergency Shelter:  [facility] 581-305-0884  Maternity Homes: - [facility] 902-816-1915 - [facility] (931)[phone]  MRSA Hotline #:   402-165-0969  Dental Assistance If unable to pay or uninsured, contact:  [facility]. to become qualified for the adult dental clinic.  Patients with Medicaid: [facility] [phone] [address], [phone] [address] [phone]  If unable to pay, or uninsured, contact [facility] [phone] in [place], [phone] in [facility]) to become qualified for the adult dental clinic  [facility] [address] [place], [place] [address] [phone] [email or web address]  Other Proofreader Services: - [facility]- [address] [place], [place], [place], [address], [phone], Ext. [phone],  2nd and 4th Thursday of the month at 6:30am.  10 clients each day by appointment, can sometimes see walk-in patients if someone does not show for an appointment. [facility]- [address] [place], [place], [address], [phone] - [facility] [address] [place], [place], [address], [phone] - [facility]- [phone] [facility]- [phone] [facility] Health [phone]       Behavioral Health Resources in the [facility]  Intensive Outpatient Programs: [facility]      [address] [place], [place] [phone] Both a day and evening program       [facility] Outpatient     [address]        [place], [place] [address] [phone]         ADS: [facility] [address] [place] [phone]  [facility] Mental Health ACCESS LINE: 505-039-6748 or [phone] [address] [place], [place] [address] [email or web address]   Substance Abuse Resources: - [facility] Drug [facility]  (351) [phone] - [facility] (386)[phone] - [facility] [place] (207)[phone] Floydene Flock [phone] - Residential & Outpatient Substance Abuse Program  ([phone]  Psychological Services: [facility]  [phone] [facility]  ([phone] - [facility], [phone] [place]. [address] [place], ACCESS LINE: [phone] or [phone], [email or web address]  Mobile Crisis Teams:                                        [facility]rapeutic Alternatives         Mobile Crisis Care Unit [phone]             [facility] [address] Ginette Otto [phone]                                         Interventionist [address] [place] [place] [phone]  Self-Help/Support  Groups: Mental Health Assoc. of [employer] of support groups (773)[phone] (call for more info)  Narcotics Anonymous (NA) Caring Services [address] [place] [address] [place] - 2 meetings at this location  Best Buy:  [facility]      [address]        [place]       [phone]         [facility] [address] [place] [address] [place], [place]  [address] [phone]  [facility] Treatment Facility  [address] [place], [place] [address] [phone] Admissions: 8am-3pm M-F  [facility]     [address]        [place], [place] [address]       [phone]         [facility] [address] [place]s [place], [place] [phone]  [facility] [address] [place], [place] [phone]  [facility] - Intensive Outpatient      [address]     [place], [place]       [phone]         [facility] (Addiction Recovery Care [facility].)     [address] [place], [place] [phone] or [phone]  Residential Treatment Services (RTS), Medicaid [address] [place], [place] [phone]  [facility] [address]                                               [address] [place] [phone]  [facility] Resources: [phone]               General Therapy                                                Angie Fava, PhD        [address] [place], [place] [address]         [phone]   Insurance  [facility] Behavioral   [address] [place], [place] [address] [phone]  [facility] [address] [place], [place] [address] [phone] Insurance/Medicaid/sponsorship through [facility]                                              [address]                                        [place], [place]  [address]    Therapy/tele-psych/case         [phone]          [facility] [address], [place]  [address]  Adolescent/group home/case management [phone]                                           Creola Corn PhD  General therapy       Insurance   [phone]         Dr. Lolly Mustache, [place], M-F [phone]  [facility]  [employer] [facility] Dept. [address]                 [address]         [address] [place] [address]  Blondell Reveal Phone:  [phone]                                  Phone:  [phone]                   Phone:  [phone]  [facility] Mental Health, [phone] - [facility] - [facility]- [phone]       -     [facility] in [place], [address],             [phone], Insurance  [place] Child Abuse Hotline 432-865-3481 or 9401824741 (After Hours)

## 2013-08-12 NOTE — ED Notes (Signed)
Pt presents with 6 day h/o pain to tip of nose.  Pt unsure of insect bite, reports area has 3 white pimples, but now has large, red, blistered area.  Pt denies any drainage to area.  Pt noted large, hard "lump" to chin that she noted last night.  Pt reports swelling to lymph nodes since Tuesday. Pt denies any cold symptoms, sore throat or mouth pain.

## 2013-08-13 NOTE — ED Provider Notes (Signed)
Medical screening examination/treatment/procedure(s) were performed by non-physician practitioner and as supervising physician I was immediately available for consultation/collaboration.  Flint Melter, MD 08/13/13 [phone]

## 2013-09-29 ENCOUNTER — Ambulatory Visit (INDEPENDENT_AMBULATORY_CARE_PROVIDER_SITE_OTHER): Payer: Medicaid Other | Admitting: *Deleted

## 2013-09-29 VITALS — BP 135/87 | HR 79 | Temp 98.6°F | Wt 229.0 lb

## 2013-09-29 DIAGNOSIS — IMO0001 Reserved for inherently not codable concepts without codable children: Secondary | ICD-10-CM

## 2013-09-29 DIAGNOSIS — Z3049 Encounter for surveillance of other contraceptives: Secondary | ICD-10-CM

## 2013-09-29 NOTE — Progress Notes (Signed)
Pt in office today for depo injection.  Injection given in left upper outer quadrant. Pt tolerated well.   Pt advised to RTO on 12/21/13 for next injection.

## 2013-12-16 ENCOUNTER — Ambulatory Visit: Payer: Medicaid Other

## 2015-05-14 ENCOUNTER — Emergency Department (HOSPITAL_COMMUNITY)
Admission: EM | Admit: 2015-05-14 | Discharge: 2015-05-14 | Disposition: A | Discharge status: Home / Self Care | Payer: 59 | Attending: Emergency Medicine | Admitting: Emergency Medicine

## 2015-05-14 ENCOUNTER — Encounter (HOSPITAL_COMMUNITY): Payer: Self-pay | Admitting: Emergency Medicine

## 2015-05-14 DIAGNOSIS — Z792 Long term (current) use of antibiotics: Secondary | ICD-10-CM | POA: Diagnosis not present

## 2015-05-14 DIAGNOSIS — H109 Unspecified conjunctivitis: Secondary | ICD-10-CM | POA: Insufficient documentation

## 2015-05-14 DIAGNOSIS — H578 Other specified disorders of eye and adnexa: Secondary | ICD-10-CM | POA: Diagnosis present

## 2015-05-14 MED ORDER — ERYTHROMYCIN 5 MG/GM OP OINT: 1.0000 "application " | TOPICAL_OINTMENT | Freq: Four times a day (QID) | OPHTHALMIC | Status: DC

## 2015-05-14 MED ORDER — TETRACAINE HCL 0.5 % OP SOLN
1.0000 [drp] | Freq: Once | OPHTHALMIC | Status: AC
  Administered 2015-05-14: 1 [drp] via OPHTHALMIC
  Filled 2015-05-14: qty 2

## 2015-05-14 MED ORDER — FLUORESCEIN SODIUM 1 MG OP STRP
1.0000 | ORAL_STRIP | Freq: Once | OPHTHALMIC | Status: AC
  Administered 2015-05-14: 1 via OPHTHALMIC
  Filled 2015-05-14: qty 1

## 2015-05-14 NOTE — ED Notes (Signed)
Pt. reports right eye irritation with redness/pain and drainage onset last week , denies injury / no blurred vision .

## 2015-05-14 NOTE — ED Notes (Signed)
Pt left with all belongings. 

## 2015-05-14 NOTE — Discharge Instructions (Signed)

## 2015-05-14 NOTE — ED Provider Notes (Signed)
CSN: [id number]     Arrival date & time 05/14/15  2044 History  This chart was scribed for Melanie Pean, PA-C, working with Serita Grit, MD by Steva Colder, ED Scribe. The patient was seen in room TR04C/TR04C at 10:10 PM.     Chief Complaint  Patient presents with  . Eye Problem      The history is provided by the patient. No language interpreter was used.    HPI Comments: Melanie Castillo is a 34 y.o. female who presents to the Emergency Department complaining of right eye irritation onset last week. She notes that she is having eye crusting in the morning. Pt reports that she had a FB sensation in her right eye. She states that she is having associated symptoms of right eye redness, right eye pain, right eye drainage, right eye itching. She denies blurred vision, photophobia, fever, chills, cough, wheezing, and any other symptoms. Pt does have a PCP at [facility] that she is able to f/u with.  History reviewed. No pertinent past medical history. Past Surgical History  Procedure Laterality Date  . No past surgeries     Family History  Problem Relation Age of Onset  . Diabetes    . Hypertension    . Colon cancer Father    History  Substance Use Topics  . Smoking status: Never Smoker   . Smokeless tobacco: Never Used  . Alcohol Use: Yes     Comment: occasional   OB History    No data available     Review of Systems  Constitutional: Negative for fever and chills.  Eyes: Positive for pain, discharge, redness and itching. Negative for photophobia and visual disturbance.       Right eye irritation  Respiratory: Negative for cough, shortness of breath and wheezing.   Skin: Negative for rash.  Neurological: Negative for dizziness, light-headedness and headaches.      Allergies  Review of patient's allergies indicates no known allergies.  Home Medications   Prior to Admission medications   Medication Sig Start Date End Date Taking? Authorizing  Provider  cephALEXin (KEFLEX) 500 MG capsule Take 1 capsule (500 mg total) by mouth 4 (four) times daily. 08/12/13   Hannah Muthersbaugh, PA-C  erythromycin ophthalmic ointment Place 1 application into both eyes every 6 (six) hours. Place 1/2 inch ribbon of ointment in the affected eye 4 times a day 05/14/15   Melanie Pean, PA-C  medroxyPROGESTERone (DEPO-PROVERA) 150 MG/ML injection Inject 1 mL (150 mg total) into the muscle every 3 (three) months. 06/08/13   Shelly Bombard, MD  sulfamethoxazole-trimethoprim (BACTRIM DS,SEPTRA DS) 800-160 MG per tablet Take 1 tablet by mouth 2 (two) times daily. One po bid x 7 days 08/12/13   Jarrett Soho Muthersbaugh, PA-C   BP 130/85 mmHg  Pulse 67  Temp(Src) 98.5 F (36.9 C) (Oral)  Resp 18  SpO2 98% Physical Exam  Constitutional: She appears well-developed and well-nourished. No distress.  Nontoxic appearing  HENT:  Head: Normocephalic and atraumatic.  Right Ear: Tympanic membrane and external ear normal.  Left Ear: Tympanic membrane and external ear normal.  Mouth/Throat: Uvula is midline, oropharynx is clear and moist and mucous membranes are normal. No oropharyngeal exudate, posterior oropharyngeal edema or posterior oropharyngeal erythema.  Eyes: EOM are normal. Pupils are equal, round, and reactive to light. Right eye exhibits no discharge. Left eye exhibits no discharge. Right conjunctiva is injected. Left conjunctiva is not injected.  Slit lamp exam:  The right eye shows no corneal abrasion and no foreign body.       The left eye shows no corneal abrasion and no foreign body.  Right eye conjunctival injection. Right eye was anesthetized with tetracaine and stained with fluorescein. No evidence of corneal abrasion. No evidence of foreign body.  Cardiovascular: Normal rate, regular rhythm and intact distal pulses.   Pulmonary/Chest: Effort normal. No respiratory distress.  Neurological: She is alert. Coordination normal.  Skin: No rash noted.  She is not diaphoretic.  Psychiatric: She has a normal mood and affect. Her behavior is normal.  Nursing note and vitals reviewed.   ED Course  Procedures (including critical care time) DIAGNOSTIC STUDIES: Oxygen Saturation is 98% on RA, nl by my interpretation.    COORDINATION OF CARE: 10:15 PM-Discussed treatment plan which includes abx eye drops with pt at bedside and pt agreed to plan.   Labs Review Labs Reviewed - No data to display  Imaging Review No results found.   EKG Interpretation None      Filed Vitals:   05/14/15 2058  BP: 130/85  Pulse: 67  Temp: 98.5 F (36.9 C)  TempSrc: Oral  Resp: 18  SpO2: 98%     MDM   Meds given in ED:  Medications  tetracaine (PONTOCAINE) 0.5 % ophthalmic solution 1 drop (not administered)  fluorescein ophthalmic strip 1 strip (not administered)    New Prescriptions   ERYTHROMYCIN OPHTHALMIC OINTMENT    Place 1 application into both eyes every 6 (six) hours. Place 1/2 inch ribbon of ointment in the affected eye 4 times a day    Final diagnoses:  Conjunctivitis of right eye   This is a 34 year old female who presents to the emergency department complaining of right eye discharge, matting, irritation and redness for the past week. On exam patient is afebrile and nontoxic appearing. Patient's visual acuity is 20/15 bilaterally. Right eye was anesthetized with tetracaine and stained for seen. No evidence of corneal abrasion. No evidence of foreign body. Exam consistent with conjunctivitis. We'll discharge with erythromycin ointment and have her follow-up with her primary care provider. I advised the patient to follow-up with their primary care provider this week. I advised the patient to return to the emergency department with new or worsening symptoms or new concerns. The patient verbalized understanding and agreement with plan.    I personally performed the services described in this documentation, which was scribed in my  presence. The recorded information has been reviewed and is accurate.    Melanie Pean, PA-C 05/14/15 [place], MD 05/18/15 [phone]

## 2015-07-19 ENCOUNTER — Encounter (HOSPITAL_COMMUNITY): Payer: Self-pay | Admitting: Neurology

## 2015-07-19 ENCOUNTER — Emergency Department (HOSPITAL_COMMUNITY)
Admission: EM | Admit: 2015-07-19 | Discharge: 2015-07-20 | Disposition: A | Discharge status: Home / Self Care | Payer: 59 | Attending: Emergency Medicine | Admitting: Emergency Medicine

## 2015-07-19 DIAGNOSIS — R112 Nausea with vomiting, unspecified: Secondary | ICD-10-CM

## 2015-07-19 DIAGNOSIS — Z79899 Other long term (current) drug therapy: Secondary | ICD-10-CM | POA: Diagnosis not present

## 2015-07-19 DIAGNOSIS — E860 Dehydration: Secondary | ICD-10-CM | POA: Diagnosis not present

## 2015-07-19 DIAGNOSIS — Z3202 Encounter for pregnancy test, result negative: Secondary | ICD-10-CM | POA: Insufficient documentation

## 2015-07-19 DIAGNOSIS — R197 Diarrhea, unspecified: Secondary | ICD-10-CM | POA: Diagnosis not present

## 2015-07-19 DIAGNOSIS — R079 Chest pain, unspecified: Secondary | ICD-10-CM | POA: Diagnosis not present

## 2015-07-19 LAB — COMPREHENSIVE METABOLIC PANEL
ALBUMIN: 3.9 g/dL (ref 3.5–5.0)
ALK PHOS: 65 U/L (ref 38–126)
ALT: 23 U/L (ref 14–54)
AST: 19 U/L (ref 15–41)
Anion gap: 9 (ref 5–15)
BUN: 10 mg/dL (ref 6–20)
CALCIUM: 9.3 mg/dL (ref 8.9–10.3)
CO2: 25 mmol/L (ref 22–32)
CREATININE: 0.82 mg/dL (ref 0.44–1.00)
Chloride: 101 mmol/L (ref 101–111)
GFR calc Af Amer: >60 mL/min (ref >60)
GFR calc non Af Amer: >60 mL/min (ref >60)
GLUCOSE: 120 mg/dL — AB (ref 65–99)
Potassium: 3.7 mmol/L (ref 3.5–5.1)
SODIUM: 135 mmol/L (ref 135–145)
Total Bilirubin: 0.4 mg/dL (ref 0.3–1.2)
Total Protein: 8 g/dL (ref 6.5–8.1)

## 2015-07-19 LAB — CBC
HCT: 39.7 % (ref 36.0–46.0)
Hemoglobin: 13 g/dL (ref 12.0–15.0)
MCH: 25.8 pg — AB (ref 26.0–34.0)
MCHC: 32.7 g/dL (ref 30.0–36.0)
MCV: 78.9 fL (ref 78.0–100.0)
PLATELETS: 233 10*3/uL (ref 150–400)
RBC: 5.03 MIL/uL (ref 3.87–5.11)
RDW: 13.5 % (ref 11.5–15.5)
WBC: 7.6 10*3/uL (ref 4.0–10.5)

## 2015-07-19 LAB — URINALYSIS, ROUTINE W REFLEX MICROSCOPIC
Bilirubin Urine: NEGATIVE
GLUCOSE, UA: NEGATIVE mg/dL
HGB URINE DIPSTICK: NEGATIVE
KETONES UR: NEGATIVE mg/dL
Nitrite: NEGATIVE
PROTEIN: NEGATIVE mg/dL
Specific Gravity, Urine: 1.016 (ref 1.005–1.030)
Urobilinogen, UA: 0.2 mg/dL (ref 0.0–1.0)
pH: 6 (ref 5.0–8.0)

## 2015-07-19 LAB — URINE MICROSCOPIC-ADD ON

## 2015-07-19 LAB — I-STAT BETA HCG BLOOD, ED (MC, WL, AP ONLY): I-stat hCG, quantitative: <5 m[IU]/mL (ref <5)

## 2015-07-19 LAB — I-STAT TROPONIN, ED: Troponin i, poc: 0 ng/mL (ref 0.00–0.08)

## 2015-07-19 LAB — LIPASE, BLOOD: Lipase: 32 U/L (ref 22–51)

## 2015-07-19 MED ORDER — ONDANSETRON HCL 4 MG/2ML IJ SOLN
4.0000 mg | Freq: Once | INTRAMUSCULAR | Status: AC
  Administered 2015-07-19: 4 mg via INTRAVENOUS
  Filled 2015-07-19: qty 2

## 2015-07-19 MED ORDER — SODIUM CHLORIDE 0.9 % IV BOLUS (SEPSIS)
1000.0000 mL | Freq: Once | INTRAVENOUS | Status: AC
  Administered 2015-07-19: 1000 mL via INTRAVENOUS

## 2015-07-19 NOTE — ED Notes (Signed)
Pt here for diarrhea for 1 week, last week told had stomach bug. Reports she doesn't feel better and on the way here got burning in her chest.

## 2015-07-19 NOTE — ED Notes (Addendum)
Denies chest pain. States when she came in it was burning. No longer burning. Thinks it was anxiety.

## 2015-07-19 NOTE — ED Provider Notes (Signed)
CSN: [id number]     Arrival date & time 07/19/15  1826 History   First MD Initiated Contact with Patient 07/19/15 1945     Chief Complaint  Patient presents with  . Diarrhea  . Chest Pain   Melanie Castillo is a 34 y.o. female with no significant past medical history who presents with a one-week history of diarrhea, nausea, and vomiting. The patient reports that she saw an urgent care several days ago and was told she had a GI bug. The patient says her symptoms have continued with the diarrhea however the nausea has slightly improved. The patient says that she has had sick contacts with her children who have had similar symptoms. The patient denies any fevers, chills, chest pain, shortness of breath, constipation, dysuria, vaginal discharge or vaginal bleeding. The patient denies any other symptoms on arrival. The patient denies any abdominal pain, any history of abdominal surgeries, or any flank pain.   (Consider location/radiation/quality/duration/timing/severity/associated sxs/prior Treatment) Patient is a 34 y.o. female presenting with diarrhea. The history is provided by the patient. No language interpreter was used.  Diarrhea Quality:  Watery Severity:  Moderate Onset quality:  Gradual Number of episodes:  5-8 per day Duration:  1 week Timing:  Constant Progression:  Improving Relieved by:  Nothing Worsened by:  Nothing tried Ineffective treatments:  None tried Associated symptoms: vomiting   Associated symptoms: no abdominal pain, no chills, no recent cough, no diaphoresis, no fever and no headaches   Vomiting:    Quality:  Stomach contents   Number of occurrences:  6   Severity:  Mild   Timing:  Sporadic   Progression:  Improving Risk factors: sick contacts   Risk factors: no recent antibiotic use     History reviewed. No pertinent past medical history. Past Surgical History  Procedure Laterality Date  . No past surgeries     Family History  Problem Relation Age of  Onset  . Diabetes    . Hypertension    . Colon cancer Father    Social History  Substance Use Topics  . Smoking status: Never Smoker   . Smokeless tobacco: Never Used  . Alcohol Use: Yes     Comment: occasional   OB History    No data available     Review of Systems  Constitutional: Negative for fever, chills, diaphoresis and appetite change.  HENT: Negative for congestion and rhinorrhea.   Respiratory: Negative for cough, choking, chest tightness, shortness of breath, wheezing and stridor.   Cardiovascular: Negative for chest pain and palpitations.  Gastrointestinal: Positive for nausea, vomiting and diarrhea. Negative for abdominal pain, constipation, abdominal distention and anal bleeding.  Genitourinary: Negative for dysuria and pelvic pain.  Musculoskeletal: Negative for back pain, neck pain and neck stiffness.  Skin: Negative for rash and wound.  Neurological: Negative for weakness, numbness and headaches.  All other systems reviewed and are negative.     Allergies  Review of patient's allergies indicates no known allergies.  Home Medications   Prior to Admission medications   Medication Sig Start Date End Date Taking? Authorizing Provider  cephALEXin (KEFLEX) 500 MG capsule Take 1 capsule (500 mg total) by mouth 4 (four) times daily. 08/12/13   Hannah Muthersbaugh, PA-C  erythromycin ophthalmic ointment Place 1 application into both eyes every 6 (six) hours. Place 1/2 inch ribbon of ointment in the affected eye 4 times a day 05/14/15   Waynetta Pean, PA-C  medroxyPROGESTERone (DEPO-PROVERA) 150 MG/ML injection Inject 1  mL (150 mg total) into the muscle every 3 (three) months. 06/08/13   Shelly Bombard, MD  sulfamethoxazole-trimethoprim (BACTRIM DS,SEPTRA DS) 800-160 MG per tablet Take 1 tablet by mouth 2 (two) times daily. One po bid x 7 days 08/12/13   Jarrett Soho Muthersbaugh, PA-C   BP 133/83 mmHg  Pulse 94  Temp(Src) 98.3 F (36.8 C) (Oral)  Resp 14  SpO2 100%   LMP 07/12/2015 Physical Exam  Constitutional: She is oriented to person, place, and time. She appears well-developed and well-nourished. No distress.  HENT:  Head: Normocephalic and atraumatic.  Mouth/Throat: No oropharyngeal exudate.  Eyes: Conjunctivae and EOM are normal. Pupils are equal, round, and reactive to light.  Neck: Normal range of motion.  Cardiovascular: Normal rate, regular rhythm, normal heart sounds and intact distal pulses.   No murmur heard. Pulmonary/Chest: Effort normal. No stridor. No respiratory distress. She has no wheezes. She exhibits no tenderness.  Abdominal: Soft. Bowel sounds are normal. She exhibits no distension. There is no tenderness. There is no rebound.  Musculoskeletal: She exhibits no tenderness.  Neurological: She is alert and oriented to person, place, and time. She has normal reflexes. No cranial nerve deficit. She exhibits normal muscle tone.  Skin: Skin is warm. She is not diaphoretic. No erythema.  Psychiatric: She has a normal mood and affect.  Nursing note and vitals reviewed.   ED Course  Procedures (including critical care time) Labs Review Labs Reviewed  COMPREHENSIVE METABOLIC PANEL - Abnormal; Notable for the following:    Glucose, Bld 120 (*)    All other components within normal limits  CBC - Abnormal; Notable for the following:    MCH 25.8 (*)    All other components within normal limits  URINALYSIS, ROUTINE W REFLEX MICROSCOPIC (NOT AT [facility]) - Abnormal; Notable for the following:    Leukocytes, UA TRACE (*)    All other components within normal limits  URINE MICROSCOPIC-ADD ON - Abnormal; Notable for the following:    Squamous Epithelial / LPF FEW (*)    Bacteria, UA FEW (*)    All other components within normal limits  LIPASE, BLOOD  I-STAT BETA HCG BLOOD, ED ([facility], [facility], [facility] ONLY)  I-STAT TROPOININ, ED    Imaging Review No results found. I have personally reviewed and evaluated these images and lab results as part of my  medical decision-making.   EKG Interpretation   Date/Time:  Wednesday July 19 2015 18:38:23 EDT Ventricular Rate:  88 PR Interval:  122 QRS Duration: 100 QT Interval:  352 QTC Calculation: 425 R Axis:   87 Text Interpretation:  Normal sinus rhythm Nonspecific ST and T wave  abnormality Abnormal ECG Confirmed by Hazle Coca [phone]) on 07/19/2015  7:44:58 PM      MDM   Melanie Castillo is a 34 y.o. female with no significant past medical history who presents with a one-week history of diarrhea, nausea, and vomiting. Based on the patient's report of significant diarrhea and nausea with vomiting, concern for possible dehydration. As the patient is not having any pain, not feel the patient requires pain medication at this time. The patient did report that she felt anxious on the drive over to the emergency department because this is where her father passed away and she was feeling some burning in her chest which resolved after several seconds. The patient had a troponin which was negative and an EKG that did not show evidence of ischemia.   In order to look for electrolyte  abnormality given the patient's significant diarrhea, the patient had labs. The patient's CMP did not show evidence of abnormality or kidney injury. The patient's CBC was unremarkable for anemia or leukocytosis. The patient's previous test was negative . Urinalysis did not show evidence of urinary tract infection.  The patient reported her nausea improved following nausea medication and the patient was feeling better overall after fluid resuscitation. The patient was given a prescription for nausea medication and reassured that she was likely feeling slightly dehydrated from her gastroenteritis. 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 The patient was given hydration precautions as well as return precautions for any new or worsening  symptoms. The patient did not have any other questions, concerns, or complaints and the patient was discharged in good condition.  This patient was seen with Dr. Ralene Bathe, emergency medicine attending.   Final diagnoses:  Dehydration  Non-intractable vomiting with nausea, vomiting of unspecified type  Diarrhea, unspecified type       Antony Blackbird, MD 07/20/15 0147  Quintella Reichert, MD 07/22/15 [phone]

## 2015-07-19 NOTE — ED Notes (Signed)
Pt believe she is dehydrated and just doesn't feel right. Feels drained and dizzy. Has increased drinking water and Gatorade.

## 2015-07-20 MED ORDER — ONDANSETRON HCL 4 MG PO TABS: 4.0000 mg | ORAL_TABLET | Freq: Four times a day (QID) | ORAL | Status: DC

## 2015-07-20 NOTE — Discharge Instructions (Signed)

## 2015-12-04 LAB — OB RESULTS CONSOLE GC/CHLAMYDIA: Chlamydia: NEGATIVE

## 2015-12-04 LAB — OB RESULTS CONSOLE HGB/HCT, BLOOD: Hemoglobin: 12.7 g/dL

## 2016-07-19 ENCOUNTER — Encounter (HOSPITAL_COMMUNITY): Payer: Self-pay | Admitting: Emergency Medicine

## 2016-07-19 ENCOUNTER — Emergency Department (HOSPITAL_COMMUNITY)
Admission: EM | Admit: 2016-07-19 | Discharge: 2016-07-19 | Disposition: A | Discharge status: Home / Self Care | Payer: BC Managed Care – PPO | Attending: Emergency Medicine | Admitting: Emergency Medicine

## 2016-07-19 DIAGNOSIS — R42 Dizziness and giddiness: Secondary | ICD-10-CM | POA: Diagnosis present

## 2016-07-19 DIAGNOSIS — H81399 Other peripheral vertigo, unspecified ear: Secondary | ICD-10-CM | POA: Diagnosis not present

## 2016-07-19 LAB — URINALYSIS, ROUTINE W REFLEX MICROSCOPIC
Bilirubin Urine: NEGATIVE
Glucose, UA: NEGATIVE mg/dL
Ketones, ur: NEGATIVE mg/dL
NITRITE: NEGATIVE
PROTEIN: NEGATIVE mg/dL
Specific Gravity, Urine: 1.006 (ref 1.005–1.030)
pH: 6.5 (ref 5.0–8.0)

## 2016-07-19 LAB — BASIC METABOLIC PANEL
Anion gap: 7 (ref 5–15)
BUN: 9 mg/dL (ref 6–20)
CHLORIDE: 105 mmol/L (ref 101–111)
CO2: 25 mmol/L (ref 22–32)
CREATININE: 0.82 mg/dL (ref 0.44–1.00)
Calcium: 9 mg/dL (ref 8.9–10.3)
GFR calc non Af Amer: >60 mL/min (ref >60)
Glucose, Bld: 123 mg/dL — ABNORMAL HIGH (ref 65–99)
Potassium: 3.5 mmol/L (ref 3.5–5.1)
SODIUM: 137 mmol/L (ref 135–145)

## 2016-07-19 LAB — URINE MICROSCOPIC-ADD ON

## 2016-07-19 LAB — CBC
HCT: 39.4 % (ref 36.0–46.0)
Hemoglobin: 12.9 g/dL (ref 12.0–15.0)
MCH: 26.2 pg (ref 26.0–34.0)
MCHC: 32.7 g/dL (ref 30.0–36.0)
MCV: 80.1 fL (ref 78.0–100.0)
PLATELETS: 213 10*3/uL (ref 150–400)
RBC: 4.92 MIL/uL (ref 3.87–5.11)
RDW: 13.5 % (ref 11.5–15.5)
WBC: 7.8 10*3/uL (ref 4.0–10.5)

## 2016-07-19 LAB — I-STAT BETA HCG BLOOD, ED (MC, WL, AP ONLY)

## 2016-07-19 LAB — CBG MONITORING, ED: GLUCOSE-CAPILLARY: 126 mg/dL — AB (ref 65–99)

## 2016-07-19 MED ORDER — MECLIZINE HCL 25 MG PO TABS
25.0000 mg | ORAL_TABLET | Freq: Once | ORAL | Status: AC
  Administered 2016-07-19: 25 mg via ORAL
  Filled 2016-07-19: qty 1

## 2016-07-19 MED ORDER — MECLIZINE HCL 25 MG PO TABS: 25.0000 mg | ORAL_TABLET | Freq: Three times a day (TID) | ORAL | 0 refills | Status: DC | PRN

## 2016-07-19 NOTE — ED Triage Notes (Signed)
Pt complaint of intermittent weakness for 1.5 weeks. Associated dizziness and nausea with standing.

## 2016-07-19 NOTE — ED Provider Notes (Signed)
[place] DEPT Provider Note: Georgena Spurling, MD, FACEP  CSN: [id number] MRN: [id number] ARRIVAL: 07/19/16 at 1810    By signing my name below, I, Georgette Shell, attest that this documentation has been prepared under the direction and in the presence of Shanon Rosser, MD. Electronically Signed: Georgette Shell, ED Scribe. 07/19/16. 11:06 PM.  CHIEF COMPLAINT  Weakness  HISTORY OF PRESENT ILLNESS  HPI Comments: Melanie Castillo is a 35 y.o. female who presents to the Emergency Department complaining of intermittent dizziness by which she means a sense of being off-balance. Onset was 1.5 weeks ago with Associated nausea and occasional vomiting. She states her ears were popping earlier in the week but denies tinnitus presently. Pt notes her symptoms are exacerbated by standing and ambulating. Symptoms are moderate. No new medications. No alleviating factors noted. Pt denies chest pain, fever, or shortness of breath.   PCP: [facility]   History reviewed. No pertinent past medical history.  Past Surgical History:  Procedure Laterality Date  . NO PAST SURGERIES      Family History  Problem Relation Age of Onset  . Colon cancer Father   . Diabetes    . Hypertension      Social History  Substance Use Topics  . Smoking status: Never Smoker  . Smokeless tobacco: Never Used  . Alcohol use Yes     Comment: occasional    Prior to Admission medications   Medication Sig Start Date End Date Taking? Authorizing Provider  meclizine (ANTIVERT) 25 MG tablet Take 1 tablet (25 mg total) by mouth 3 (three) times daily as needed (for vertigo). 07/19/16   Shanon Rosser, MD    Allergies Review of patient's allergies indicates no known allergies.   REVIEW OF SYSTEMS  Negative except as noted here or in the History of Present Illness.   PHYSICAL EXAMINATION  Initial Vital Signs Blood pressure 134/85, pulse 87, temperature 98.8 F (37.1 C), temperature source Oral, resp. rate 18, weight 237 lb  (107.5 kg), last menstrual period 06/21/2016, SpO2 100 %.  Examination General: Well-developed, well-nourished female in no acute distress; appearance consistent with age of record HENT: normocephalic; atraumatic; TMs normal Eyes: pupils equal, round and reactive to light; extraocular muscles intact; No nystagmus Neck: supple Heart: regular rate and rhythm Lungs: clear to auscultation bilaterally Abdomen: soft; nondistended; nontender; no masses or hepatosplenomegaly; bowel sounds present Extremities: No deformity; full range of motion; pulses normal Neurologic: Awake, alert and oriented; motor function intact in all extremities and symmetric; no facial droop; normal coordination and speech; negative Romberg; normal finger to nose Skin: Warm and dry Psychiatric: Normal mood and affect   RESULTS  Summary of this visit's results, reviewed by myself:   EKG Interpretation  Date/Time:    Ventricular Rate:    PR Interval:    QRS Duration:   QT Interval:    QTC Calculation:   R Axis:     Text Interpretation:        Laboratory Studies: Results for orders placed or performed during the hospital encounter of 07/19/16 (from the past 24 hour(s))  Urinalysis, Routine w reflex microscopic     Status: Abnormal   Collection Time: 07/19/16  6:30 PM  Result Value Ref Range   Color, Urine YELLOW YELLOW   APPearance CLEAR CLEAR   Specific Gravity, Urine 1.006 1.005 - 1.030   pH 6.5 5.0 - 8.0   Glucose, UA NEGATIVE NEGATIVE mg/dL   Hgb urine dipstick SMALL (A) NEGATIVE   Bilirubin  Urine NEGATIVE NEGATIVE   Ketones, ur NEGATIVE NEGATIVE mg/dL   Protein, ur NEGATIVE NEGATIVE mg/dL   Nitrite NEGATIVE NEGATIVE   Leukocytes, UA TRACE (A) NEGATIVE  Urine microscopic-add on     Status: Abnormal   Collection Time: 07/19/16  6:30 PM  Result Value Ref Range   Squamous Epithelial / LPF 0-5 (A) NONE SEEN   WBC, UA 0-5 0 - 5 WBC/hpf   RBC / HPF 0-5 0 - 5 RBC/hpf   Bacteria, UA FEW (A) NONE SEEN    Basic metabolic panel     Status: Abnormal   Collection Time: 07/19/16  6:54 PM  Result Value Ref Range   Sodium 137 135 - 145 mmol/L   Potassium 3.5 3.5 - 5.1 mmol/L   Chloride 105 101 - 111 mmol/L   CO2 25 22 - 32 mmol/L   Glucose, Bld 123 (H) 65 - 99 mg/dL   BUN 9 6 - 20 mg/dL   Creatinine, Ser 0.82 0.44 - 1.00 mg/dL   Calcium 9.0 8.9 - 10.3 mg/dL   GFR calc non Af Amer >60 >60 mL/min   GFR calc Af Amer >60 >60 mL/min   Anion gap 7 5 - 15  CBC     Status: None   Collection Time: 07/19/16  6:54 PM  Result Value Ref Range   WBC 7.8 4.0 - 10.5 K/uL   RBC 4.92 3.87 - 5.11 MIL/uL   Hemoglobin 12.9 12.0 - 15.0 g/dL   HCT 39.4 36.0 - 46.0 %   MCV 80.1 78.0 - 100.0 fL   MCH 26.2 26.0 - 34.0 pg   MCHC 32.7 30.0 - 36.0 g/dL   RDW 13.5 11.5 - 15.5 %   Platelets 213 150 - 400 K/uL  CBG monitoring, ED     Status: Abnormal   Collection Time: 07/19/16  6:54 PM  Result Value Ref Range   Glucose-Capillary 126 (H) 65 - 99 mg/dL  I-Stat beta hCG blood, ED     Status: None   Collection Time: 07/19/16  7:03 PM  Result Value Ref Range   I-stat hCG, quantitative <5.0 <5 mIU/mL   Comment 3           Imaging Studies: No results found.  ED COURSE  Nursing notes and initial vitals signs, including pulse oximetry, reviewed.  Vitals:   07/19/16 1828 07/19/16 2104 07/19/16 2247  BP: 137/84 132/79 134/85  Pulse: 88 72 87  Resp: 20 20 18   Temp: 98.8 F (37.1 C)    TempSrc: Oral    SpO2: 99% 100% 100%  Weight: 237 lb (107.5 kg)     Patient will follow-up with her primary care physician at [facility].  PROCEDURES    ED DIAGNOSES     ICD-9-CM ICD-10-CM   1. Peripheral vertigo, unspecified laterality 386.10 H81.399     I personally performed the services described in this documentation, which was scribed in my presence. The recorded information has been reviewed and is accurate.    Shanon Rosser, MD 07/19/16 [phone]

## 2016-07-20 ENCOUNTER — Ambulatory Visit: Payer: Medicaid Other

## 2016-08-27 DIAGNOSIS — Z3490 Encounter for supervision of normal pregnancy, unspecified, unspecified trimester: Secondary | ICD-10-CM | POA: Insufficient documentation

## 2016-09-02 LAB — OB RESULTS CONSOLE ABO/RH: RH TYPE: POSITIVE

## 2016-09-02 LAB — OB RESULTS CONSOLE GC/CHLAMYDIA: Gonorrhea: NEGATIVE

## 2016-09-02 LAB — OB RESULTS CONSOLE PLATELET COUNT: PLATELETS: 218 10*3/uL

## 2016-09-02 LAB — OB RESULTS CONSOLE ANTIBODY SCREEN: ANTIBODY SCREEN: NEGATIVE

## 2016-09-02 LAB — OB RESULTS CONSOLE RUBELLA ANTIBODY, IGM: Rubella: IMMUNE

## 2016-09-02 LAB — OB RESULTS CONSOLE HIV ANTIBODY (ROUTINE TESTING): HIV: NONREACTIVE

## 2016-10-14 NOTE — L&D Delivery Note (Signed)
Delivery Note At 5:59 PM a viable female was delivered via  (Presentation:vertex ; LOA ).  APGAR:8 ,9 ; weight  .   Placenta status:spont ,shultz .  Cord:3vc  with the following complications: none.  Cord pH: n/a  Anesthesia:  epidural Episiotomynone:   Lacerations:  none Suture Repair: n/a Est. Blood Loss 50 (mL):    Mom to postpartum.  Baby to Couplet care / Skin to Skin.  Koren Shiver 04/13/2017, 6:27 PM

## 2016-10-17 DIAGNOSIS — O99210 Obesity complicating pregnancy, unspecified trimester: Secondary | ICD-10-CM | POA: Insufficient documentation

## 2016-10-17 DIAGNOSIS — O09529 Supervision of elderly multigravida, unspecified trimester: Secondary | ICD-10-CM | POA: Insufficient documentation

## 2016-11-07 ENCOUNTER — Encounter (HOSPITAL_COMMUNITY): Payer: Self-pay | Admitting: Emergency Medicine

## 2016-11-07 ENCOUNTER — Emergency Department (HOSPITAL_COMMUNITY)
Admission: EM | Admit: 2016-11-07 | Discharge: 2016-11-07 | Disposition: A | Discharge status: Home / Self Care | Payer: BC Managed Care – PPO | Attending: Emergency Medicine | Admitting: Emergency Medicine

## 2016-11-07 ENCOUNTER — Emergency Department (HOSPITAL_COMMUNITY): Payer: BC Managed Care – PPO

## 2016-11-07 DIAGNOSIS — Y939 Activity, unspecified: Secondary | ICD-10-CM | POA: Diagnosis not present

## 2016-11-07 DIAGNOSIS — Y999 Unspecified external cause status: Secondary | ICD-10-CM | POA: Diagnosis not present

## 2016-11-07 DIAGNOSIS — Z3A14 14 weeks gestation of pregnancy: Secondary | ICD-10-CM | POA: Diagnosis not present

## 2016-11-07 DIAGNOSIS — O9A212 Injury, poisoning and certain other consequences of external causes complicating pregnancy, second trimester: Secondary | ICD-10-CM | POA: Diagnosis not present

## 2016-11-07 DIAGNOSIS — R109 Unspecified abdominal pain: Secondary | ICD-10-CM

## 2016-11-07 DIAGNOSIS — Y92410 Unspecified street and highway as the place of occurrence of the external cause: Secondary | ICD-10-CM | POA: Insufficient documentation

## 2016-11-07 LAB — CBC WITH DIFFERENTIAL/PLATELET
Basophils Absolute: 0 10*3/uL (ref 0.0–0.1)
Basophils Relative: 0 %
Eosinophils Absolute: 0.1 10*3/uL (ref 0.0–0.7)
Eosinophils Relative: 1 %
HCT: 34.2 % — ABNORMAL LOW (ref 36.0–46.0)
Hemoglobin: 11.2 g/dL — ABNORMAL LOW (ref 12.0–15.0)
LYMPHS ABS: 1.5 10*3/uL (ref 0.7–4.0)
LYMPHS PCT: 15 %
MCH: 25 pg — AB (ref 26.0–34.0)
MCHC: 32.7 g/dL (ref 30.0–36.0)
MCV: 76.3 fL — AB (ref 78.0–100.0)
MONO ABS: 0.4 10*3/uL (ref 0.1–1.0)
MONOS PCT: 4 %
Neutro Abs: 8 10*3/uL — ABNORMAL HIGH (ref 1.7–7.7)
Neutrophils Relative %: 80 %
Platelets: 183 10*3/uL (ref 150–400)
RBC: 4.48 MIL/uL (ref 3.87–5.11)
RDW: 13.8 % (ref 11.5–15.5)
WBC: 10 10*3/uL (ref 4.0–10.5)

## 2016-11-07 LAB — COMPREHENSIVE METABOLIC PANEL
ALT: 25 U/L (ref 14–54)
ANION GAP: 9 (ref 5–15)
AST: 21 U/L (ref 15–41)
Albumin: 3.2 g/dL — ABNORMAL LOW (ref 3.5–5.0)
Alkaline Phosphatase: 42 U/L (ref 38–126)
BUN: 5 mg/dL — ABNORMAL LOW (ref 6–20)
CALCIUM: 8.9 mg/dL (ref 8.9–10.3)
CHLORIDE: 107 mmol/L (ref 101–111)
CO2: 20 mmol/L — ABNORMAL LOW (ref 22–32)
CREATININE: 0.53 mg/dL (ref 0.44–1.00)
Glucose, Bld: 92 mg/dL (ref 65–99)
Potassium: 3.8 mmol/L (ref 3.5–5.1)
Sodium: 136 mmol/L (ref 135–145)
Total Bilirubin: 0.5 mg/dL (ref 0.3–1.2)
Total Protein: 7.5 g/dL (ref 6.5–8.1)

## 2016-11-07 LAB — URINALYSIS, ROUTINE W REFLEX MICROSCOPIC
BILIRUBIN URINE: NEGATIVE
GLUCOSE, UA: NEGATIVE mg/dL
HGB URINE DIPSTICK: NEGATIVE
Ketones, ur: 5 mg/dL — AB
Leukocytes, UA: NEGATIVE
Nitrite: NEGATIVE
PH: 6 (ref 5.0–8.0)
Protein, ur: NEGATIVE mg/dL
SPECIFIC GRAVITY, URINE: 1.01 (ref 1.005–1.030)

## 2016-11-07 MED ORDER — ACETAMINOPHEN 500 MG PO TABS
1000.0000 mg | ORAL_TABLET | Freq: Once | ORAL | Status: AC
  Administered 2016-11-07: 1000 mg via ORAL
  Filled 2016-11-07: qty 2

## 2016-11-07 NOTE — Discharge Instructions (Signed)
Not hesitate to return to the emergency department or [facility] emergency receiving if he started having vaginal bleeding severe abdominal pain.  You can take Tylenol for pain control as follows: Take acetaminophen (Tylenol) up to 975 mg (this is normally 3 over-the-counter pills) up to 3 times a day. Do not drink alcohol. Make sure your other medications do not contain acetaminophen (Read the labels!)
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
## 2016-11-07 NOTE — ED Notes (Signed)
Papers reviewed with patient and PA present to answer questions
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
## 2016-11-27 ENCOUNTER — Ambulatory Visit (INDEPENDENT_AMBULATORY_CARE_PROVIDER_SITE_OTHER): Payer: BC Managed Care – PPO | Admitting: Obstetrics and Gynecology

## 2016-11-27 ENCOUNTER — Encounter: Payer: Self-pay | Admitting: *Deleted

## 2016-11-27 ENCOUNTER — Encounter: Payer: Self-pay | Admitting: Obstetrics and Gynecology

## 2016-11-27 DIAGNOSIS — Z3490 Encounter for supervision of normal pregnancy, unspecified, unspecified trimester: Secondary | ICD-10-CM | POA: Insufficient documentation

## 2016-11-27 DIAGNOSIS — O09522 Supervision of elderly multigravida, second trimester: Secondary | ICD-10-CM

## 2016-11-27 DIAGNOSIS — O09529 Supervision of elderly multigravida, unspecified trimester: Secondary | ICD-10-CM | POA: Insufficient documentation

## 2016-11-27 DIAGNOSIS — O0932 Supervision of pregnancy with insufficient antenatal care, second trimester: Secondary | ICD-10-CM

## 2016-11-27 DIAGNOSIS — O0930 Supervision of pregnancy with insufficient antenatal care, unspecified trimester: Secondary | ICD-10-CM | POA: Insufficient documentation

## 2016-11-27 NOTE — Progress Notes (Signed)
Subjective:  Melanie Castillo is a 36 y.o. G4P3003 at [redacted]w[redacted]d being seen today for her first prenatal visit at [facility]. Transferring from [place]. Prenatal records pending. No chronic medical problems. TSVD x 3 without problems.  She is currently monitored for the following issues for this low-risk pregnancy and has Encounter for supervision of normal pregnancy, antepartum and AMA (advanced maternal age) multigravida 35+ on her problem list.  Patient reports no complaints.   .  .   . Denies leaking of fluid.   The following portions of the patient's history were reviewed and updated as appropriate: allergies, current medications, past family history, past medical history, past social history, past surgical history and problem list. Problem list updated.  Objective:  There were no vitals filed for this visit.  Fetal Status:           General:  Alert, oriented and cooperative. Patient is in no acute distress.  Skin: Skin is warm and dry. No rash noted.   Cardiovascular: Normal heart rate noted  Respiratory: Normal respiratory effort, no problems with respiration noted  Abdomen: Soft, gravid, appropriate for gestational age.       Pelvic:  Cervical exam deferred        Extremities: Normal range of motion.     Mental Status: Normal mood and affect. Normal behavior. Normal judgment and thought content.   Urinalysis:      Assessment and Plan:  Pregnancy: G4P3003 at [redacted]w[redacted]d  1. Late prenatal care   2. Encounter for supervision of normal pregnancy, antepartum, unspecified gravidity  - AFP, Quad Screen - [place] MFM OB COMP + 14 WK; Future  3. Elderly multigravida in second trimester Quad screen  Preterm labor symptoms and general obstetric precautions including but not limited to vaginal bleeding, contractions, leaking of fluid and fetal movement were reviewed in detail with the patient. Please refer to After Visit Summary for other counseling recommendations.  Return in about 4 weeks (around  12/25/2016) for OB visit.   Chancy Milroy, MD

## 2016-11-27 NOTE — Patient Instructions (Signed)
Second Trimester of Pregnancy The second trimester is from week 13 through week 28 (months 4 through 6). The second trimester is often a time when you feel your best. Your body has also adjusted to being pregnant, and you begin to feel better physically. Usually, morning sickness has lessened or quit completely, you may have more energy, and you may have an increase in appetite. The second trimester is also a time when the fetus is growing rapidly. At the end of the sixth month, the fetus is about 9 inches long and weighs about 1 pounds. You will likely begin to feel the baby move (quickening) between 18 and 20 weeks of the pregnancy. Body changes during your second trimester Your body continues to go through many changes during your second trimester. The changes vary from woman to woman.  Your weight will continue to increase. You will notice your lower abdomen bulging out.  You may begin to get stretch marks on your hips, abdomen, and breasts.  You may develop headaches that can be relieved by medicines. The medicines should be approved by your health care provider.  You may urinate more often because the fetus is pressing on your bladder.  You may develop or continue to have heartburn as a result of your pregnancy.  You may develop constipation because certain hormones are causing the muscles that push waste through your intestines to slow down.  You may develop hemorrhoids or swollen, bulging veins (varicose veins).  You may have back pain. This is caused by:  Weight gain.  Pregnancy hormones that are relaxing the joints in your pelvis.  A shift in weight and the muscles that support your balance.  Your breasts will continue to grow and they will continue to become tender.  Your gums may bleed and may be sensitive to brushing and flossing.  Dark spots or blotches (chloasma, mask of pregnancy) may develop on your face. This will likely fade after the baby is born.  A dark line  from your belly button to the pubic area (linea nigra) may appear. This will likely fade after the baby is born.  You may have changes in your hair. These can include thickening of your hair, rapid growth, and changes in texture. Some women also have hair loss during or after pregnancy, or hair that feels dry or thin. Your hair will most likely return to normal after your baby is born. What to expect at prenatal visits During a routine prenatal visit:  You will be weighed to make sure you and the fetus are growing normally.  Your blood pressure will be taken.  Your abdomen will be measured to track your baby's growth.  The fetal heartbeat will be listened to.  Any test results from the previous visit will be discussed. Your health care provider may ask you:  How you are feeling.  If you are feeling the baby move.  If you have had any abnormal symptoms, such as leaking fluid, bleeding, severe headaches, or abdominal cramping.  If you are using any tobacco products, including cigarettes, chewing tobacco, and electronic cigarettes.  If you have any questions. Other tests that may be performed during your second trimester include:  Blood tests that check for:  Low iron levels (anemia).  Gestational diabetes (between 24 and 28 weeks).  Rh antibodies. This is to check for a protein on red blood cells (Rh factor).  Urine tests to check for infections, diabetes, or protein in the urine.  An ultrasound to  confirm the proper growth and development of the baby.  An amniocentesis to check for possible genetic problems.  Fetal screens for spina bifida and Down syndrome.  HIV (human immunodeficiency virus) testing. Routine prenatal testing includes screening for HIV, unless you choose not to have this test. Follow these instructions at home: Eating and drinking  Continue to eat regular, healthy meals.  Avoid raw meat, uncooked cheese, cat litter boxes, and soil used by cats. These  carry germs that can cause birth defects in the baby.  Take your prenatal vitamins.  Take 1500-2000 mg of calcium daily starting at the 20th week of pregnancy until you deliver your baby.  If you develop constipation:  Take over-the-counter or prescription medicines.  Drink enough fluid to keep your urine clear or pale yellow.  Eat foods that are high in fiber, such as fresh fruits and vegetables, whole grains, and beans.  Limit foods that are high in fat and processed sugars, such as fried and sweet foods. Activity  Exercise only as directed by your health care provider. Experiencing uterine cramps is a good sign to stop exercising.  Avoid heavy lifting, wear low heel shoes, and practice good posture.  Wear your seat belt at all times when driving.  Rest with your legs elevated if you have leg cramps or low back pain.  Wear a good support bra for breast tenderness.  Do not use hot tubs, steam rooms, or saunas. Lifestyle  Avoid all smoking, herbs, alcohol, and unprescribed drugs. These chemicals affect the formation and growth of the baby.  Do not use any products that contain nicotine or tobacco, such as cigarettes and e-cigarettes. If you need help quitting, ask your health care provider.  A sexual relationship may be continued unless your health care provider directs you otherwise. General instructions  Follow your health care provider's instructions regarding medicine use. There are medicines that are either safe or unsafe to take during pregnancy.  Take warm sitz baths to soothe any pain or discomfort caused by hemorrhoids. Use hemorrhoid cream if your health care provider approves.  If you develop varicose veins, wear support hose. Elevate your feet for 15 minutes, 3-4 times a day. Limit salt in your diet.  Visit your dentist if you have not gone yet during your pregnancy. Use a soft toothbrush to brush your teeth and be gentle when you floss.  Keep all follow-up  prenatal visits as told by your health care provider. This is important. Contact a health care provider if:  You have dizziness.  You have mild pelvic cramps, pelvic pressure, or nagging pain in the abdominal area.  You have persistent nausea, vomiting, or diarrhea.  You have a bad smelling vaginal discharge.  You have pain with urination. Get help right away if:  You have a fever.  You are leaking fluid from your vagina.  You have spotting or bleeding from your vagina.  You have severe abdominal cramping or pain.  You have rapid weight gain or weight loss.  You have shortness of breath with chest pain.  You notice sudden or extreme swelling of your face, hands, ankles, feet, or legs.  You have not felt your baby move in over an hour.  You have severe headaches that do not go away with medicine.  You have vision changes. Summary  The second trimester is from week 13 through week 28 (months 4 through 6). It is also a time when the fetus is growing rapidly.  Your body goes  through many changes during pregnancy. The changes vary from woman to woman.  Avoid all smoking, herbs, alcohol, and unprescribed drugs. These chemicals affect the formation and growth your baby.  Do not use any tobacco products, such as cigarettes, chewing tobacco, and e-cigarettes. If you need help quitting, ask your health care provider.  Contact your health care provider if you have any questions. Keep all prenatal visits as told by your health care provider. This is important. This information is not intended to replace advice given to you by your health care provider. Make sure you discuss any questions you have with your health care provider. Document Released: 09/24/2001 Document Revised: 03/07/2016 Document Reviewed: 12/01/2012 Elsevier Interactive Patient Education  2017 [employer].

## 2016-11-27 NOTE — Addendum Note (Signed)
Addended by: Maryruth Eve on: 11/27/2016 04:49 PM   Modules accepted: Orders

## 2016-11-29 LAB — URINE CULTURE, OB REFLEX

## 2016-11-29 LAB — CULTURE, OB URINE

## 2016-12-02 ENCOUNTER — Ambulatory Visit (HOSPITAL_COMMUNITY)
Admission: RE | Admit: 2016-12-02 | Discharge: 2016-12-02 | Disposition: A | Discharge status: Home / Self Care | Payer: BC Managed Care – PPO | Source: Ambulatory Visit | Attending: Obstetrics and Gynecology | Admitting: Obstetrics and Gynecology

## 2016-12-02 ENCOUNTER — Other Ambulatory Visit: Payer: Self-pay | Admitting: Obstetrics and Gynecology

## 2016-12-02 DIAGNOSIS — O09522 Supervision of elderly multigravida, second trimester: Secondary | ICD-10-CM

## 2016-12-02 DIAGNOSIS — Z3689 Encounter for other specified antenatal screening: Secondary | ICD-10-CM | POA: Insufficient documentation

## 2016-12-02 DIAGNOSIS — Z3A19 19 weeks gestation of pregnancy: Secondary | ICD-10-CM

## 2016-12-02 DIAGNOSIS — Z3490 Encounter for supervision of normal pregnancy, unspecified, unspecified trimester: Secondary | ICD-10-CM

## 2016-12-02 DIAGNOSIS — O99212 Obesity complicating pregnancy, second trimester: Secondary | ICD-10-CM

## 2016-12-03 LAB — TOXASSURE SELECT 13 (MW), URINE

## 2016-12-05 ENCOUNTER — Other Ambulatory Visit: Payer: Self-pay | Admitting: *Deleted

## 2016-12-05 DIAGNOSIS — Z3492 Encounter for supervision of normal pregnancy, unspecified, second trimester: Secondary | ICD-10-CM

## 2016-12-05 LAB — AFP, QUAD SCREEN
DIA MOM VALUE: 0.97
DIA VALUE (EIA): 130.96 pg/mL
DSR (By Age)    1 IN: 180
DSR (SECOND TRIMESTER) 1 IN: 1084
GESTATIONAL AGE AFP: 18.6 wk
MSAFP Mom: 1.07
MSAFP: 40.7 ng/mL
MSHCG Mom: 1.41
MSHCG: 26700 m[IU]/mL
Maternal Age At EDD: 37.2 YEARS
OSB RISK: 10000
T18 (By Age): 1:700 {titer}
Test Results:: NEGATIVE
UE3 MOM: 0.95
WEIGHT: 246 [lb_av]
uE3 Value: 1.26 ng/mL

## 2016-12-05 NOTE — Progress Notes (Signed)
Order for u/s placed for f/u anatomy per Dr Rip Harbour.

## 2016-12-14 ENCOUNTER — Inpatient Hospital Stay (HOSPITAL_COMMUNITY)
Admission: AD | Admit: 2016-12-14 | Discharge: 2016-12-14 | Disposition: A | Discharge status: Home / Self Care | Payer: BC Managed Care – PPO | Source: Ambulatory Visit | Attending: Obstetrics & Gynecology | Admitting: Obstetrics & Gynecology

## 2016-12-14 ENCOUNTER — Encounter (HOSPITAL_COMMUNITY): Payer: Self-pay | Admitting: *Deleted

## 2016-12-14 DIAGNOSIS — N760 Acute vaginitis: Secondary | ICD-10-CM | POA: Insufficient documentation

## 2016-12-14 DIAGNOSIS — Z3A21 21 weeks gestation of pregnancy: Secondary | ICD-10-CM | POA: Diagnosis not present

## 2016-12-14 DIAGNOSIS — N949 Unspecified condition associated with female genital organs and menstrual cycle: Secondary | ICD-10-CM | POA: Diagnosis not present

## 2016-12-14 DIAGNOSIS — B9689 Other specified bacterial agents as the cause of diseases classified elsewhere: Secondary | ICD-10-CM | POA: Insufficient documentation

## 2016-12-14 DIAGNOSIS — O26892 Other specified pregnancy related conditions, second trimester: Secondary | ICD-10-CM | POA: Insufficient documentation

## 2016-12-14 DIAGNOSIS — O219 Vomiting of pregnancy, unspecified: Secondary | ICD-10-CM | POA: Insufficient documentation

## 2016-12-14 DIAGNOSIS — R109 Unspecified abdominal pain: Secondary | ICD-10-CM | POA: Diagnosis present

## 2016-12-14 HISTORY — DX: Other specified health status: Z78.9

## 2016-12-14 LAB — URINALYSIS, ROUTINE W REFLEX MICROSCOPIC
BILIRUBIN URINE: NEGATIVE
GLUCOSE, UA: NEGATIVE mg/dL
HGB URINE DIPSTICK: NEGATIVE
KETONES UR: NEGATIVE mg/dL
Leukocytes, UA: NEGATIVE
NITRITE: NEGATIVE
PH: 6 (ref 5.0–8.0)
Protein, ur: NEGATIVE mg/dL
Specific Gravity, Urine: 1.016 (ref 1.005–1.030)

## 2016-12-14 LAB — LIPASE, BLOOD: Lipase: 30 U/L (ref 11–51)

## 2016-12-14 LAB — COMPREHENSIVE METABOLIC PANEL
ALT: 35 U/L (ref 14–54)
ANION GAP: 7 (ref 5–15)
AST: 20 U/L (ref 15–41)
Albumin: 2.9 g/dL — ABNORMAL LOW (ref 3.5–5.0)
Alkaline Phosphatase: 46 U/L (ref 38–126)
BILIRUBIN TOTAL: 0.2 mg/dL — AB (ref 0.3–1.2)
BUN: 8 mg/dL (ref 6–20)
CO2: 21 mmol/L — ABNORMAL LOW (ref 22–32)
Calcium: 8.8 mg/dL — ABNORMAL LOW (ref 8.9–10.3)
Chloride: 107 mmol/L (ref 101–111)
Creatinine, Ser: 0.47 mg/dL (ref 0.44–1.00)
Glucose, Bld: 107 mg/dL — ABNORMAL HIGH (ref 65–99)
Potassium: 3.4 mmol/L — ABNORMAL LOW (ref 3.5–5.1)
Sodium: 135 mmol/L (ref 135–145)
TOTAL PROTEIN: 7 g/dL (ref 6.5–8.1)

## 2016-12-14 LAB — CBC
HCT: 30.5 % — ABNORMAL LOW (ref 36.0–46.0)
Hemoglobin: 10.4 g/dL — ABNORMAL LOW (ref 12.0–15.0)
MCH: 25.9 pg — AB (ref 26.0–34.0)
MCHC: 34.1 g/dL (ref 30.0–36.0)
MCV: 76.1 fL — AB (ref 78.0–100.0)
PLATELETS: 182 10*3/uL (ref 150–400)
RBC: 4.01 MIL/uL (ref 3.87–5.11)
RDW: 14 % (ref 11.5–15.5)
WBC: 8.7 10*3/uL (ref 4.0–10.5)

## 2016-12-14 LAB — HEPATITIS B SURFACE ANTIGEN: Hepatitis B Surface Ag: NEGATIVE

## 2016-12-14 LAB — WET PREP, GENITAL
SPERM: NONE SEEN
Trich, Wet Prep: NONE SEEN
YEAST WET PREP: NONE SEEN

## 2016-12-14 MED ORDER — METRONIDAZOLE 500 MG PO TABS: 500.0000 mg | ORAL_TABLET | Freq: Two times a day (BID) | ORAL | 0 refills | Status: DC

## 2016-12-14 MED ORDER — PROMETHAZINE HCL 25 MG PO TABS: 25.0000 mg | ORAL_TABLET | Freq: Four times a day (QID) | ORAL | 0 refills | Status: DC | PRN

## 2016-12-14 MED ORDER — PROMETHAZINE HCL 25 MG PO TABS
25.0000 mg | ORAL_TABLET | Freq: Four times a day (QID) | ORAL | Status: DC | PRN
  Administered 2016-12-14: 25 mg via ORAL
  Filled 2016-12-14: qty 1

## 2016-12-14 NOTE — Discharge Instructions (Signed)
Round Ligament Pain The round ligament is a cord of muscle and tissue that helps to support the uterus. It can become a source of pain during pregnancy if it becomes stretched or twisted as the baby grows. The pain usually begins in the second trimester of pregnancy, and it can come and go until the baby is delivered. It is not a serious problem, and it does not cause harm to the baby. Round ligament pain is usually a short, sharp, and pinching pain, but it can also be a dull, lingering, and aching pain. The pain is felt in the lower side of the abdomen or in the groin. It usually starts deep in the groin and moves up to the outside of the hip area. Pain can occur with:  A sudden change in position.  Rolling over in bed.  Coughing or sneezing.  Physical activity. Follow these instructions at home: Watch your condition for any changes. Take these steps to help with your pain:  When the pain starts, relax. Then try:  Sitting down.  Flexing your knees up to your abdomen.  Lying on your side with one pillow under your abdomen and another pillow between your legs.  Sitting in a warm bath for 15-20 minutes or until the pain goes away.  Take over-the-counter and prescription medicines only as told by your health care provider.  Move slowly when you sit and stand.  Avoid long walks if they cause pain.  Stop or lessen your physical activities if they cause pain. Contact a health care provider if:  Your pain does not go away with treatment.  You feel pain in your back that you did not have before.  Your medicine is not helping. Get help right away if:  You develop a fever or chills.  You develop uterine contractions.  You develop vaginal bleeding.  You develop nausea or vomiting.  You develop diarrhea.  You have pain when you urinate. This information is not intended to replace advice given to you by your health care provider. Make sure you discuss any questions you have with  your health care provider. Document Released: 07/09/2008 Document Revised: 03/07/2016 Document Reviewed: 12/07/2014 Elsevier Interactive Patient Education  2017 [employer] Morning Sickness Morning sickness is when you feel sick to your stomach (nauseous) during pregnancy. You may feel sick to your stomach and throw up (vomit). You may feel sick in the morning, but you can feel this way any time of day. Some women feel very sick to their stomach and cannot stop throwing up (hyperemesis gravidarum). Follow these instructions at home:  Only take medicines as told by your doctor.  Take multivitamins as told by your doctor. Taking multivitamins before getting pregnant can stop or lessen the harshness of morning sickness.  Eat dry toast or unsalted crackers before getting out of bed.  Eat 5 to 6 small meals a day.  Eat dry and bland foods like rice and baked potatoes.  Do not drink liquids with meals. Drink between meals.  Do not eat greasy, fatty, or spicy foods.  Have someone cook for you if the smell of food causes you to feel sick or throw up.  If you feel sick to your stomach after taking prenatal vitamins, take them at night or with a snack.  Eat protein when you need a snack (nuts, yogurt, cheese).  Eat unsweetened gelatins for dessert.  Wear a bracelet used for sea sickness (acupressure wristband).  Go to a doctor that puts thin  needles into certain body points (acupuncture) to improve how you feel.  Do not smoke.  Use a humidifier to keep the air in your house free of odors.  Get lots of fresh air. Contact a doctor if:  You need medicine to feel better.  You feel dizzy or lightheaded.  You are losing weight. Get help right away if:  You feel very sick to your stomach and cannot stop throwing up.  You pass out (faint). This information is not intended to replace advice given to you by your health care provider. Make sure you discuss any questions you have with  your health care provider. Document Released: 11/07/2004 Document Revised: 03/07/2016 Document Reviewed: 03/17/2013 Elsevier Interactive Patient Education  2017 [employer].

## 2016-12-14 NOTE — MAU Provider Note (Signed)
History     CSN: [id number]  Arrival date and time: 12/14/16 1123   First Provider Initiated Contact with Patient 12/14/16 1152      Chief Complaint  Patient presents with  . Abdominal Pain  . Nausea   G4P3003 @21  weeks here with LLQ pain. Pain started this am. Describes as constant and "like a contraction". Rates pain 9/10. Sitting or standing worsens the pain. She used Tylenol this am but then had emesis shortly after, so no relief. She reports N/V since early pregnancy but has worsened this week. She has used B6 in the past but has not helped. Denies diarrhea and constipation. Denies VB or vaginal discharge. Reports +FM. She has tolerated some food and fluids today.    OB History    Gravida Para Term Preterm AB Living   4 3 3     3    SAB TAB Ectopic Multiple Live Births           3      Past Medical History:  Diagnosis Date  . Medical history non-contributory     Past Surgical History:  Procedure Laterality Date  . NO PAST SURGERIES      Family History  Problem Relation Age of Onset  . Colon cancer Father   . Diabetes    . Hypertension      Social History  Substance Use Topics  . Smoking status: Never Smoker  . Smokeless tobacco: Never Used  . Alcohol use Yes     Comment: occasional    Allergies: No Known Allergies  Prescriptions Prior to Admission  Medication Sig Dispense Refill Last Dose  . acetaminophen (TYLENOL) 325 MG tablet Take 325-650 mg by mouth every 6 (six) hours as needed (for nausea/vomiting).   12/14/2016 at Unknown time  . PAZEO 0.7 % SOLN Place 1 drop into both eyes daily.   Past Week at Unknown time  . Prenatal Vit-Fe Fumarate-FA (PRENATAL MULTIVITAMIN) TABS tablet Take 1 tablet by mouth daily at 12 noon.   12/13/2016 at Unknown time    Review of Systems  Constitutional: Negative for fever.  Gastrointestinal: Positive for abdominal pain, nausea and vomiting. Negative for constipation and diarrhea.  Genitourinary: Positive for frequency  and urgency. Negative for dysuria, hematuria and vaginal bleeding.   Physical Exam   Blood pressure 124/62, pulse 92, temperature 98.2 F (36.8 C), temperature source Oral, resp. rate 18, height 5\' 4"  (1.626 m), weight 109.8 kg (242 lb), last menstrual period 07/20/2016.  Physical Exam  Nursing note and vitals reviewed. Constitutional: She is oriented to person, place, and time. She appears well-developed and well-nourished. No distress.  HENT:  Head: Normocephalic and atraumatic.  Neck: Normal range of motion.  Cardiovascular: Normal rate.   Respiratory: Effort normal.  GI: Soft. She exhibits no distension and no mass. There is no tenderness. There is no rebound and no guarding.  Genitourinary:  Genitourinary Comments: External: no lesions or erythema Vagina: rugated, parous, creamy malodorous discharge SVE: closed/long  Musculoskeletal: Normal range of motion.  Neurological: She is alert and oriented to person, place, and time.  Skin: Skin is warm and dry.  Psychiatric: She has a normal mood and affect.   FHT: 154 bpm  Results for orders placed or performed during the hospital encounter of 12/14/16 (from the past 24 hour(s))  Urinalysis, Routine w reflex microscopic     Status: Abnormal   Collection Time: 12/14/16 11:30 AM  Result Value Ref Range   Color, Urine YELLOW YELLOW  APPearance HAZY (A) CLEAR   Specific Gravity, Urine 1.016 1.005 - 1.030   pH 6.0 5.0 - 8.0   Glucose, UA NEGATIVE NEGATIVE mg/dL   Hgb urine dipstick NEGATIVE NEGATIVE   Bilirubin Urine NEGATIVE NEGATIVE   Ketones, ur NEGATIVE NEGATIVE mg/dL   Protein, ur NEGATIVE NEGATIVE mg/dL   Nitrite NEGATIVE NEGATIVE   Leukocytes, UA NEGATIVE NEGATIVE  Wet prep, genital     Status: Abnormal   Collection Time: 12/14/16 12:04 PM  Result Value Ref Range   Yeast Wet Prep HPF POC NONE SEEN NONE SEEN   Trich, Wet Prep NONE SEEN NONE SEEN   Clue Cells Wet Prep HPF POC PRESENT (A) NONE SEEN   WBC, Wet Prep HPF  POC FEW (A) NONE SEEN   Sperm NONE SEEN   CBC     Status: Abnormal   Collection Time: 12/14/16 12:30 PM  Result Value Ref Range   WBC 8.7 4.0 - 10.5 K/uL   RBC 4.01 3.87 - 5.11 MIL/uL   Hemoglobin 10.4 (L) 12.0 - 15.0 g/dL   HCT 30.5 (L) 36.0 - 46.0 %   MCV 76.1 (L) 78.0 - 100.0 fL   MCH 25.9 (L) 26.0 - 34.0 pg   MCHC 34.1 30.0 - 36.0 g/dL   RDW 14.0 11.5 - 15.5 %   Platelets 182 150 - 400 K/uL  Lipase, blood     Status: None   Collection Time: 12/14/16 12:30 PM  Result Value Ref Range   Lipase 30 11 - 51 U/L  Comprehensive metabolic panel     Status: Abnormal   Collection Time: 12/14/16 12:30 PM  Result Value Ref Range   Sodium 135 135 - 145 mmol/L   Potassium 3.4 (L) 3.5 - 5.1 mmol/L   Chloride 107 101 - 111 mmol/L   CO2 21 (L) 22 - 32 mmol/L   Glucose, Bld 107 (H) 65 - 99 mg/dL   BUN 8 6 - 20 mg/dL   Creatinine, Ser 0.47 0.44 - 1.00 mg/dL   Calcium 8.8 (L) 8.9 - 10.3 mg/dL   Total Protein 7.0 6.5 - 8.1 g/dL   Albumin 2.9 (L) 3.5 - 5.0 g/dL   AST 20 15 - 41 U/L   ALT 35 14 - 54 U/L   Alkaline Phosphatase 46 38 - 126 U/L   Total Bilirubin 0.2 (L) 0.3 - 1.2 mg/dL   GFR calc non Af Amer >60 >60 mL/min   GFR calc Af Amer >60 >60 mL/min   Anion gap 7 5 - 15    MAU Course  Procedures Phenergan 25 mg po  MDM Labs ordered and reviewed. No evidence of acute abdominal or pelvic process. No signs of UTI or PTL. Pain likely d/t round ligament. Will treat BV. Reports nausea improved. No emesis during encounter. Tolerating po. Stable for discharge home.  Assessment and Plan   1. [redacted] weeks gestation of pregnancy   2. Nausea/vomiting in pregnancy   3. Round ligament pain   4. Bacterial vaginosis    Discharge home Follow up in office as scheduled PTL precautions Comfort measures  Allergies as of 12/14/2016   No Known Allergies     Medication List    TAKE these medications   acetaminophen 325 MG tablet Commonly known as:  TYLENOL Take 325-650 mg by mouth every 6 (six)  hours as needed (for nausea/vomiting).   metroNIDAZOLE 500 MG tablet Commonly known as:  FLAGYL Take 1 tablet (500 mg total) by mouth 2 (two) times daily.  PAZEO 0.7 % Soln Generic drug:  Olopatadine HCl Place 1 drop into both eyes daily.   prenatal multivitamin Tabs tablet Take 1 tablet by mouth daily at 12 noon.   promethazine 25 MG tablet Commonly known as:  PHENERGAN Take 1 tablet (25 mg total) by mouth every 6 (six) hours as needed for nausea or vomiting.      Julianne Handler, CNM 12/14/2016, 11:54 AM

## 2016-12-14 NOTE — MAU Note (Addendum)
Patient has had nausea all work, vomiting, having abdominal pain in lower left quadrant which started this morning, no vaginal bleeding, denies diarrhea.

## 2016-12-15 LAB — RPR: RPR Ser Ql: NONREACTIVE

## 2016-12-16 LAB — GC/CHLAMYDIA PROBE AMP (~~LOC~~) NOT AT ARMC
CHLAMYDIA, DNA PROBE: NEGATIVE
Neisseria Gonorrhea: NEGATIVE

## 2016-12-25 ENCOUNTER — Ambulatory Visit (INDEPENDENT_AMBULATORY_CARE_PROVIDER_SITE_OTHER): Payer: Medicaid Other | Admitting: Obstetrics & Gynecology

## 2016-12-25 DIAGNOSIS — Z3480 Encounter for supervision of other normal pregnancy, unspecified trimester: Secondary | ICD-10-CM

## 2016-12-25 DIAGNOSIS — Z3483 Encounter for supervision of other normal pregnancy, third trimester: Secondary | ICD-10-CM

## 2016-12-25 MED ORDER — PANTOPRAZOLE SODIUM 40 MG PO TBEC: 40.0000 mg | DELAYED_RELEASE_TABLET | Freq: Every day | ORAL | 1 refills | Status: DC

## 2016-12-25 NOTE — Patient Instructions (Signed)

## 2016-12-25 NOTE — Progress Notes (Signed)
Pt states she doesn't feel like herself lately, past 3-4 days. Pt states she has had vertigo in past, not sure if it could be due to that. Pt states she does have some anxiety.

## 2016-12-25 NOTE — Progress Notes (Signed)
Nausea and weight loss, no heartburn but this was a problem with previous pregnancy   PRENATAL VISIT NOTE  Subjective:  Melanie Castillo is a 36 y.o. G4P3003 at [redacted]w[redacted]d being seen today for ongoing prenatal care.  She is currently monitored for the following issues for this low-risk pregnancy and has Supervision of normal pregnancy, antepartum and AMA (advanced maternal age) multigravida 35+ on her problem list.  Patient reports nausea.  Contractions: Not present.  .  Movement: Present. Denies leaking of fluid.   The following portions of the patient's history were reviewed and updated as appropriate: allergies, current medications, past family history, past medical history, past social history, past surgical history and problem list. Problem list updated.  Objective:   Vitals:   12/25/16 1613  BP: 111/73  Pulse: 96  Weight: 241 lb (109.3 kg)    Fetal Status: Fetal Heart Rate (bpm): 150   Movement: Present     General:  Alert, oriented and cooperative. Patient is in no acute distress.  Skin: Skin is warm and dry. No rash noted.   Cardiovascular: Normal heart rate noted  Respiratory: Normal respiratory effort, no problems with respiration noted  Abdomen: Soft, gravid, appropriate for gestational age. Pain/Pressure: Absent     Pelvic:  Cervical exam deferred        Extremities: Normal range of motion.     Mental Status: Normal mood and affect. Normal behavior. Normal judgment and thought content.   Assessment and Plan:  Pregnancy: G4P3003 at [redacted]w[redacted]d  1. Supervision of other normal pregnancy, antepartum Nausea, weight loss and h/o reflux previous pregnancy Protonix added Preterm labor symptoms and general obstetric precautions including but not limited to vaginal bleeding, contractions, leaking of fluid and fetal movement were reviewed in detail with the patient. Please refer to After Visit Summary for other counseling recommendations.  4 week, 2 hr GT  Woodroe Mode, MD

## 2017-01-13 ENCOUNTER — Other Ambulatory Visit: Payer: Self-pay | Admitting: Obstetrics and Gynecology

## 2017-01-13 ENCOUNTER — Ambulatory Visit (HOSPITAL_COMMUNITY)
Admission: RE | Admit: 2017-01-13 | Discharge: 2017-01-13 | Disposition: A | Discharge status: Home / Self Care | Payer: BC Managed Care – PPO | Source: Ambulatory Visit | Attending: Obstetrics and Gynecology | Admitting: Obstetrics and Gynecology

## 2017-01-13 DIAGNOSIS — Z3492 Encounter for supervision of normal pregnancy, unspecified, second trimester: Secondary | ICD-10-CM | POA: Diagnosis not present

## 2017-01-13 DIAGNOSIS — Z362 Encounter for other antenatal screening follow-up: Secondary | ICD-10-CM | POA: Diagnosis present

## 2017-01-13 DIAGNOSIS — IMO0002 Reserved for concepts with insufficient information to code with codable children: Secondary | ICD-10-CM

## 2017-01-13 DIAGNOSIS — Z3A25 25 weeks gestation of pregnancy: Secondary | ICD-10-CM | POA: Diagnosis not present

## 2017-01-13 DIAGNOSIS — Z0489 Encounter for examination and observation for other specified reasons: Secondary | ICD-10-CM

## 2017-01-29 ENCOUNTER — Encounter: Payer: Self-pay | Admitting: Obstetrics

## 2017-01-29 ENCOUNTER — Ambulatory Visit (INDEPENDENT_AMBULATORY_CARE_PROVIDER_SITE_OTHER): Payer: BC Managed Care – PPO | Admitting: Obstetrics & Gynecology

## 2017-01-29 DIAGNOSIS — Z3480 Encounter for supervision of other normal pregnancy, unspecified trimester: Secondary | ICD-10-CM

## 2017-01-29 DIAGNOSIS — Z3483 Encounter for supervision of other normal pregnancy, third trimester: Secondary | ICD-10-CM

## 2017-01-29 NOTE — Progress Notes (Signed)
   PRENATAL VISIT NOTE  Subjective:  Melanie Castillo is a 36 y.o. G4P3003 at [redacted]w[redacted]d being seen today for ongoing prenatal care.  She is currently monitored for the following issues for this low-risk pregnancy and has Supervision of normal pregnancy, antepartum and AMA (advanced maternal age) multigravida 35+ on her problem list.  Patient reports no complaints.  Contractions: Irritability. Vag. Bleeding: None.  Movement: Present. Denies leaking of fluid.   The following portions of the patient's history were reviewed and updated as appropriate: allergies, current medications, past family history, past medical history, past social history, past surgical history and problem list. Problem list updated.  Objective:   Vitals:   01/29/17 1604  BP: 122/80  Pulse: (!) 105  Weight: 245 lb 6.4 oz (111.3 kg)    Fetal Status: Fetal Heart Rate (bpm): 154   Movement: Present     General:  Alert, oriented and cooperative. Patient is in no acute distress.  Skin: Skin is warm and dry. No rash noted.   Cardiovascular: Normal heart rate noted  Respiratory: Normal respiratory effort, no problems with respiration noted  Abdomen: Soft, gravid, appropriate for gestational age. Pain/Pressure: Absent     Pelvic:  Cervical exam deferred        Extremities: Normal range of motion.  Edema: Trace  Mental Status: Normal mood and affect. Normal behavior. Normal judgment and thought content.   Assessment and Plan:  Pregnancy: G4P3003 at [redacted]w[redacted]d  1. Supervision of other normal pregnancy, antepartum Dates reviewed and recalculated  Preterm labor symptoms and general obstetric precautions including but not limited to vaginal bleeding, contractions, leaking of fluid and fetal movement were reviewed in detail with the patient. Please refer to After Visit Summary for other counseling recommendations.  Return in about 2 weeks (around 02/12/2017) for needs 2 hr TT.   Woodroe Mode, MD

## 2017-01-29 NOTE — Progress Notes (Signed)
Patient reports good fetal movement, denies pain/pressure.

## 2017-01-29 NOTE — Patient Instructions (Signed)
Third Trimester of Pregnancy The third trimester is from week 28 through week 40 (months 7 through 9). The third trimester is a time when the unborn baby (fetus) is growing rapidly. At the end of the ninth month, the fetus is about 20 inches in length and weighs 6-10 pounds. Body changes during your third trimester Your body will continue to go through many changes during pregnancy. The changes vary from woman to woman. During the third trimester:  Your weight will continue to increase. You can expect to gain 25-35 pounds (11-16 kg) by the end of the pregnancy.  You may begin to get stretch marks on your hips, abdomen, and breasts.  You may urinate more often because the fetus is moving lower into your pelvis and pressing on your bladder.  You may develop or continue to have heartburn. This is caused by increased hormones that slow down muscles in the digestive tract.  You may develop or continue to have constipation because increased hormones slow digestion and cause the muscles that push waste through your intestines to relax.  You may develop hemorrhoids. These are swollen veins (varicose veins) in the rectum that can itch or be painful.  You may develop swollen, bulging veins (varicose veins) in your legs.  You may have increased body aches in the pelvis, back, or thighs. This is due to weight gain and increased hormones that are relaxing your joints.  You may have changes in your hair. These can include thickening of your hair, rapid growth, and changes in texture. Some women also have hair loss during or after pregnancy, or hair that feels dry or thin. Your hair will most likely return to normal after your baby is born.  Your breasts will continue to grow and they will continue to become tender. A yellow fluid (colostrum) may leak from your breasts. This is the first milk you are producing for your baby.  Your belly button may stick out.  You may notice more swelling in your hands,  face, or ankles.  You may have increased tingling or numbness in your hands, arms, and legs. The skin on your belly may also feel numb.  You may feel short of breath because of your expanding uterus.  You may have more problems sleeping. This can be caused by the size of your belly, increased need to urinate, and an increase in your body's metabolism.  You may notice the fetus "dropping," or moving lower in your abdomen (lightening).  You may have increased vaginal discharge.  You may notice your joints feel loose and you may have pain around your pelvic bone.  What to expect at prenatal visits You will have prenatal exams every 2 weeks until week 36. Then you will have weekly prenatal exams. During a routine prenatal visit:  You will be weighed to make sure you and the baby are growing normally.  Your blood pressure will be taken.  Your abdomen will be measured to track your baby's growth.  The fetal heartbeat will be listened to.  Any test results from the previous visit will be discussed.  You may have a cervical check near your due date to see if your cervix has softened or thinned (effaced).  You will be tested for Group B streptococcus. This happens between 35 and 37 weeks.  Your health care provider may ask you:  What your birth plan is.  How you are feeling.  If you are feeling the baby move.  If you have had   any abnormal symptoms, such as leaking fluid, bleeding, severe headaches, or abdominal cramping.  If you are using any tobacco products, including cigarettes, chewing tobacco, and electronic cigarettes.  If you have any questions.  Other tests or screenings that may be performed during your third trimester include:  Blood tests that check for low iron levels (anemia).  Fetal testing to check the health, activity level, and growth of the fetus. Testing is done if you have certain medical conditions or if there are problems during the  pregnancy.  Nonstress test (NST). This test checks the health of your baby to make sure there are no signs of problems, such as the baby not getting enough oxygen. During this test, a belt is placed around your belly. The baby is made to move, and its heart rate is monitored during movement.  What is false labor? False labor is a condition in which you feel small, irregular tightenings of the muscles in the womb (contractions) that usually go away with rest, changing position, or drinking water. These are called Braxton Hicks contractions. Contractions may last for hours, days, or even weeks before true labor sets in. If contractions come at regular intervals, become more frequent, increase in intensity, or become painful, you should see your health care provider. What are the signs of labor?  Abdominal cramps.  Regular contractions that start at 10 minutes apart and become stronger and more frequent with time.  Contractions that start on the top of the uterus and spread down to the lower abdomen and back.  Increased pelvic pressure and dull back pain.  A watery or bloody mucus discharge that comes from the vagina.  Leaking of amniotic fluid. This is also known as your "water breaking." It could be a slow trickle or a gush. Let your health care provider know if it has a color or strange odor. If you have any of these signs, call your health care provider right away, even if it is before your due date. Follow these instructions at home: Medicines  Follow your health care provider's instructions regarding medicine use. Specific medicines may be either safe or unsafe to take during pregnancy.  Take a prenatal vitamin that contains at least 600 micrograms (mcg) of folic acid.  If you develop constipation, try taking a stool softener if your health care provider approves. Eating and drinking  Eat a balanced diet that includes fresh fruits and vegetables, whole grains, good sources of protein  such as meat, eggs, or tofu, and low-fat dairy. Your health care provider will help you determine the amount of weight gain that is right for you.  Avoid raw meat and uncooked cheese. These carry germs that can cause birth defects in the baby.  If you have low calcium intake from food, talk to your health care provider about whether you should take a daily calcium supplement.  Eat four or five small meals rather than three large meals a day.  Limit foods that are high in fat and processed sugars, such as fried and sweet foods.  To prevent constipation: ? Drink enough fluid to keep your urine clear or pale yellow. ? Eat foods that are high in fiber, such as fresh fruits and vegetables, whole grains, and beans. Activity  Exercise only as directed by your health care provider. Most women can continue their usual exercise routine during pregnancy. Try to exercise for 30 minutes at least 5 days a week. Stop exercising if you experience uterine contractions.  Avoid heavy   lifting.  Do not exercise in extreme heat or humidity, or at high altitudes.  Wear low-heel, comfortable shoes.  Practice good posture.  You may continue to have sex unless your health care provider tells you otherwise. Relieving pain and discomfort  Take frequent breaks and rest with your legs elevated if you have leg cramps or low back pain.  Take warm sitz baths to soothe any pain or discomfort caused by hemorrhoids. Use hemorrhoid cream if your health care provider approves.  Wear a good support bra to prevent discomfort from breast tenderness.  If you develop varicose veins: ? Wear support pantyhose or compression stockings as told by your healthcare provider. ? Elevate your feet for 15 minutes, 3-4 times a day. Prenatal care  Write down your questions. Take them to your prenatal visits.  Keep all your prenatal visits as told by your health care provider. This is important. Safety  Wear your seat belt at  all times when driving.  Make a list of emergency phone numbers, including numbers for family, friends, the hospital, and police and fire departments. General instructions  Avoid cat litter boxes and soil used by cats. These carry germs that can cause birth defects in the baby. If you have a cat, ask someone to clean the litter box for you.  Do not travel far distances unless it is absolutely necessary and only with the approval of your health care provider.  Do not use hot tubs, steam rooms, or saunas.  Do not drink alcohol.  Do not use any products that contain nicotine or tobacco, such as cigarettes and e-cigarettes. If you need help quitting, ask your health care provider.  Do not use any medicinal herbs or unprescribed drugs. These chemicals affect the formation and growth of the baby.  Do not douche or use tampons or scented sanitary pads.  Do not cross your legs for long periods of time.  To prepare for the arrival of your baby: ? Take prenatal classes to understand, practice, and ask questions about labor and delivery. ? Make a trial run to the hospital. ? Visit the hospital and tour the maternity area. ? Arrange for maternity or paternity leave through employers. ? Arrange for family and friends to take care of pets while you are in the hospital. ? Purchase a rear-facing car seat and make sure you know how to install it in your car. ? Pack your hospital bag. ? Prepare the baby's nursery. Make sure to remove all pillows and stuffed animals from the baby's crib to prevent suffocation.  Visit your dentist if you have not gone during your pregnancy. Use a soft toothbrush to brush your teeth and be gentle when you floss. Contact a health care provider if:  You are unsure if you are in labor or if your water has broken.  You become dizzy.  You have mild pelvic cramps, pelvic pressure, or nagging pain in your abdominal area.  You have lower back pain.  You have persistent  nausea, vomiting, or diarrhea.  You have an unusual or bad smelling vaginal discharge.  You have pain when you urinate. Get help right away if:  Your water breaks before 37 weeks.  You have regular contractions less than 5 minutes apart before 37 weeks.  You have a fever.  You are leaking fluid from your vagina.  You have spotting or bleeding from your vagina.  You have severe abdominal pain or cramping.  You have rapid weight loss or weight gain.    You have shortness of breath with chest pain.  You notice sudden or extreme swelling of your face, hands, ankles, feet, or legs.  Your baby makes fewer than 10 movements in 2 hours.  You have severe headaches that do not go away when you take medicine.  You have vision changes. Summary  The third trimester is from week 28 through week 40, months 7 through 9. The third trimester is a time when the unborn baby (fetus) is growing rapidly.  During the third trimester, your discomfort may increase as you and your baby continue to gain weight. You may have abdominal, leg, and back pain, sleeping problems, and an increased need to urinate.  During the third trimester your breasts will keep growing and they will continue to become tender. A yellow fluid (colostrum) may leak from your breasts. This is the first milk you are producing for your baby.  False labor is a condition in which you feel small, irregular tightenings of the muscles in the womb (contractions) that eventually go away. These are called Braxton Hicks contractions. Contractions may last for hours, days, or even weeks before true labor sets in.  Signs of labor can include: abdominal cramps; regular contractions that start at 10 minutes apart and become stronger and more frequent with time; watery or bloody mucus discharge that comes from the vagina; increased pelvic pressure and dull back pain; and leaking of amniotic fluid. This information is not intended to replace advice  given to you by your health care provider. Make sure you discuss any questions you have with your health care provider. Document Released: 09/24/2001 Document Revised: 03/07/2016 Document Reviewed: 12/01/2012 Elsevier Interactive Patient Education  2017 [employer]  

## 2017-02-10 ENCOUNTER — Inpatient Hospital Stay (HOSPITAL_COMMUNITY)
Admission: AD | Admit: 2017-02-10 | Discharge: 2017-02-10 | Disposition: A | Discharge status: Home / Self Care | Payer: BC Managed Care – PPO | Source: Ambulatory Visit | Attending: Family Medicine | Admitting: Family Medicine

## 2017-02-10 ENCOUNTER — Encounter (HOSPITAL_COMMUNITY): Payer: Self-pay | Admitting: *Deleted

## 2017-02-10 DIAGNOSIS — Z8249 Family history of ischemic heart disease and other diseases of the circulatory system: Secondary | ICD-10-CM | POA: Diagnosis not present

## 2017-02-10 DIAGNOSIS — Z3A29 29 weeks gestation of pregnancy: Secondary | ICD-10-CM | POA: Insufficient documentation

## 2017-02-10 DIAGNOSIS — O212 Late vomiting of pregnancy: Secondary | ICD-10-CM | POA: Diagnosis not present

## 2017-02-10 DIAGNOSIS — O219 Vomiting of pregnancy, unspecified: Secondary | ICD-10-CM | POA: Diagnosis not present

## 2017-02-10 DIAGNOSIS — Z833 Family history of diabetes mellitus: Secondary | ICD-10-CM | POA: Insufficient documentation

## 2017-02-10 DIAGNOSIS — O9989 Other specified diseases and conditions complicating pregnancy, childbirth and the puerperium: Secondary | ICD-10-CM | POA: Diagnosis not present

## 2017-02-10 LAB — URINALYSIS, ROUTINE W REFLEX MICROSCOPIC
Bilirubin Urine: NEGATIVE
Glucose, UA: NEGATIVE mg/dL
Hgb urine dipstick: NEGATIVE
KETONES UR: NEGATIVE mg/dL
LEUKOCYTES UA: NEGATIVE
NITRITE: NEGATIVE
PH: 7 (ref 5.0–8.0)
PROTEIN: NEGATIVE mg/dL
Specific Gravity, Urine: 1.005 (ref 1.005–1.030)

## 2017-02-10 MED ORDER — ONDANSETRON 4 MG PO TBDP: 4.0000 mg | ORAL_TABLET | Freq: Three times a day (TID) | ORAL | 0 refills | Status: DC | PRN

## 2017-02-10 MED ORDER — ONDANSETRON 4 MG PO TBDP
4.0000 mg | ORAL_TABLET | Freq: Once | ORAL | Status: AC
  Administered 2017-02-10: 4 mg via ORAL
  Filled 2017-02-10: qty 1

## 2017-02-10 NOTE — MAU Note (Signed)
Pt started vomiting yesterday, only ate one meal yesterday, took phenergan but it didn't help.  Ate breakfast this morning which stayed down, but nothing else.  Feels very tired, fatigued.  Denies diarrhea.  No contractions, bleeding or LOF.  Has urinary frequency.

## 2017-02-10 NOTE — MAU Provider Note (Signed)
History     CSN: [id number]  Arrival date and time: 02/10/17 1446   First Provider Initiated Contact with Patient 02/10/17 1628      Chief Complaint  Patient presents with  . Emesis During Pregnancy   HPI   Ms.Melanie Castillo is a 36 y.o. female [phone] @ [redacted]w[redacted]d here in MAU with N/V. This has been an ongoing problem in this pregnancy.  She took phenergan last night and does not feel like it is working. She has vomited 5 times in the last 24 hours.  + fetal movement, denies vaginal bleeding or leaking of fluid.   OB History    Gravida Para Term Preterm AB Living   4 3 3     3    SAB TAB Ectopic Multiple Live Births           3      Past Medical History:  Diagnosis Date  . Medical history non-contributory     Past Surgical History:  Procedure Laterality Date  . NO PAST SURGERIES      Family History  Problem Relation Age of Onset  . Diabetes Mother   . Hypertension Mother   . Colon cancer Father   . Diabetes    . Hypertension      Social History  Substance Use Topics  . Smoking status: Never Smoker  . Smokeless tobacco: Never Used  . Alcohol use Yes     Comment: occasional    Allergies: No Known Allergies  Prescriptions Prior to Admission  Medication Sig Dispense Refill Last Dose  . acetaminophen (TYLENOL) 325 MG tablet Take 325-650 mg by mouth every 6 (six) hours as needed (for nausea/vomiting).   months at Unknown time  . Prenatal Vit-Fe Fumarate-FA (PRENATAL MULTIVITAMIN) TABS tablet Take 1 tablet by mouth daily at 12 noon.   02/09/2017 at Unknown time  . promethazine (PHENERGAN) 25 MG tablet Take 1 tablet (25 mg total) by mouth every 6 (six) hours as needed for nausea or vomiting. 30 tablet 0 02/09/2017 at Unknown time  . metroNIDAZOLE (FLAGYL) 500 MG tablet Take 1 tablet (500 mg total) by mouth 2 (two) times daily. (Patient not taking: Reported on 12/25/2016) 14 tablet 0 Not Taking  . pantoprazole (PROTONIX) 40 MG tablet Take 1 tablet (40 mg total) by mouth  daily. (Patient not taking: Reported on 01/29/2017) 30 tablet 1 Not Taking   Results for orders placed or performed during the hospital encounter of 02/10/17 (from the past 48 hour(s))  Urinalysis, Routine w reflex microscopic     Status: Abnormal   Collection Time: 02/10/17  2:58 PM  Result Value Ref Range   Color, Urine STRAW (A) YELLOW   APPearance CLEAR CLEAR   Specific Gravity, Urine 1.005 1.005 - 1.030   pH 7.0 5.0 - 8.0   Glucose, UA NEGATIVE NEGATIVE mg/dL   Hgb urine dipstick NEGATIVE NEGATIVE   Bilirubin Urine NEGATIVE NEGATIVE   Ketones, ur NEGATIVE NEGATIVE mg/dL   Protein, ur NEGATIVE NEGATIVE mg/dL   Nitrite NEGATIVE NEGATIVE   Leukocytes, UA NEGATIVE NEGATIVE    Review of Systems  Constitutional: Positive for fatigue.  Gastrointestinal: Negative for abdominal pain.  Genitourinary: Negative for vaginal bleeding and vaginal discharge.  Neurological: Negative for dizziness.   Physical Exam   Blood pressure 124/73, pulse 96, temperature 97.6 F (36.4 C), temperature source Oral, resp. rate 18, last menstrual period 07/17/2016.  Physical Exam  Constitutional: She is oriented to person, place, and time. She appears well-developed and  well-nourished.  Non-toxic appearance. She does not have a sickly appearance. She does not appear ill. No distress.  Eyes: Pupils are equal, round, and reactive to light.  Musculoskeletal: Normal range of motion.  Neurological: She is alert and oriented to person, place, and time.  Skin: Skin is warm. She is not diaphoretic.  Psychiatric: Her behavior is normal.   Fetal Tracing: Baseline: 125 bpm Variability: Moderate  Accelerations: 15x15 Decelerations: None Toco:  None  MAU Course  Procedures  None  MDM  UA without signs of dehydration Zofran 4 mg PO PO challenge in MAU: pass  Assessment and Plan   A:  1. Nausea and vomiting during pregnancy     P:  Discharge home in stable condition Rx: Zofran Small, frequent  meals Return to MAU if symptoms worsen Follow up with OB as scheduled or sooner if needed  Lezlie Lye, NP 02/10/2017 6:14 PM

## 2017-02-10 NOTE — Discharge Instructions (Signed)

## 2017-02-12 ENCOUNTER — Telehealth: Payer: Self-pay | Admitting: *Deleted

## 2017-02-12 MED ORDER — METOCLOPRAMIDE HCL 10 MG PO TABS: 10.0000 mg | ORAL_TABLET | Freq: Four times a day (QID) | ORAL | 0 refills | Status: DC | PRN

## 2017-02-12 NOTE — Telephone Encounter (Signed)
Pt called stating she spoke with someone Monday about her

## 2017-02-12 NOTE — Telephone Encounter (Signed)
TC from pt c/o N&V. Seen at [facility] 4/30. Pt is taking Zofran and Phenergan with no relief. Pt can not keep down any foods. Last meal 2 days ago. Consulted with Dr. Rip Harbour and Reglan was sent to the pharmacy.

## 2017-02-17 ENCOUNTER — Encounter: Payer: Self-pay | Admitting: Obstetrics and Gynecology

## 2017-02-17 ENCOUNTER — Other Ambulatory Visit: Payer: BC Managed Care – PPO

## 2017-02-17 ENCOUNTER — Ambulatory Visit (INDEPENDENT_AMBULATORY_CARE_PROVIDER_SITE_OTHER): Payer: BC Managed Care – PPO | Admitting: Obstetrics and Gynecology

## 2017-02-17 VITALS — BP 121/76 | HR 89 | Wt 241.2 lb

## 2017-02-17 DIAGNOSIS — Z3483 Encounter for supervision of other normal pregnancy, third trimester: Secondary | ICD-10-CM

## 2017-02-17 DIAGNOSIS — Z3480 Encounter for supervision of other normal pregnancy, unspecified trimester: Secondary | ICD-10-CM

## 2017-02-17 DIAGNOSIS — O09523 Supervision of elderly multigravida, third trimester: Secondary | ICD-10-CM

## 2017-02-17 NOTE — Progress Notes (Signed)
   PRENATAL VISIT NOTE  Subjective:  Melanie Castillo is a 36 y.o. G4P3003 at [redacted]w[redacted]d being seen today for ongoing prenatal care.  She is currently monitored for the following issues for this low-risk pregnancy and has Supervision of normal pregnancy, antepartum and AMA (advanced maternal age) multigravida 35+ on her problem list.  Patient reports no complaints.  Contractions: Irregular. Vag. Bleeding: None.  Movement: Present. Denies leaking of fluid.   The following portions of the patient's history were reviewed and updated as appropriate: allergies, current medications, past family history, past medical history, past social history, past surgical history and problem list. Problem list updated.  Objective:   Vitals:   02/17/17 0829  BP: 121/76  Pulse: 89  Weight: 241 lb 3.2 oz (109.4 kg)    Fetal Status: Fetal Heart Rate (bpm): 139 Fundal Height: 31 cm Movement: Present     General:  Alert, oriented and cooperative. Patient is in no acute distress.  Skin: Skin is warm and dry. No rash noted.   Cardiovascular: Normal heart rate noted  Respiratory: Normal respiratory effort, no problems with respiration noted  Abdomen: Soft, gravid, appropriate for gestational age. Pain/Pressure: Absent     Pelvic:  Cervical exam deferred        Extremities: Normal range of motion.  Edema: None  Mental Status: Normal mood and affect. Normal behavior. Normal judgment and thought content.   Assessment and Plan:  Pregnancy: G4P3003 at [redacted]w[redacted]d  1. Supervision of other normal pregnancy, antepartum Patient is doing well without complaints Third trimester labs and glucola today Patient is considering the Tdap but does not want it today Patient is still nauseous. She was given a Rx for diclegis but has not started to take it  - Glucose Tolerance, 2 Hours w/1 Hour - CBC - HIV antibody - RPR  2. Elderly multigravida in third trimester Normal quad screen  Preterm labor symptoms and general obstetric  precautions including but not limited to vaginal bleeding, contractions, leaking of fluid and fetal movement were reviewed in detail with the patient. Please refer to After Visit Summary for other counseling recommendations.  Return in about 2 weeks (around 03/03/2017) for ROB.   Lissandro Dilorenzo, Vickii Chafe, MD

## 2017-02-17 NOTE — Progress Notes (Signed)
Patient did have pink spotting when wiping on Saturday- nothing since.Contractions are not daily- they come and go.

## 2017-02-18 ENCOUNTER — Other Ambulatory Visit: Payer: Self-pay | Admitting: Obstetrics and Gynecology

## 2017-02-18 DIAGNOSIS — O24419 Gestational diabetes mellitus in pregnancy, unspecified control: Secondary | ICD-10-CM | POA: Insufficient documentation

## 2017-02-18 LAB — GLUCOSE TOLERANCE, 2 HOURS W/ 1HR
GLUCOSE, 2 HOUR: 120 mg/dL (ref 65–152)
Glucose, 1 hour: 182 mg/dL — ABNORMAL HIGH (ref 65–179)
Glucose, Fasting: 87 mg/dL (ref 65–91)

## 2017-02-18 LAB — RPR: RPR: NONREACTIVE

## 2017-02-18 LAB — HIV ANTIBODY (ROUTINE TESTING W REFLEX): HIV Screen 4th Generation wRfx: NONREACTIVE

## 2017-02-18 LAB — CBC
HEMATOCRIT: 33.8 % — AB (ref 34.0–46.6)
Hemoglobin: 11.3 g/dL (ref 11.1–15.9)
MCH: 24.7 pg — ABNORMAL LOW (ref 26.6–33.0)
MCHC: 33.4 g/dL (ref 31.5–35.7)
MCV: 74 fL — ABNORMAL LOW (ref 79–97)
Platelets: 188 10*3/uL (ref 150–379)
RBC: 4.58 x10E6/uL (ref 3.77–5.28)
RDW: 15.1 % (ref 12.3–15.4)
WBC: 8.9 10*3/uL (ref 3.4–10.8)

## 2017-02-18 MED ORDER — ACCU-CHEK NANO SMARTVIEW W/DEVICE KIT: 1.0000 | PACK | 0 refills | Status: DC

## 2017-02-18 MED ORDER — GLUCOSE BLOOD VI STRP: ORAL_STRIP | 12 refills | Status: DC

## 2017-02-18 MED ORDER — ACCU-CHEK FASTCLIX LANCETS MISC: 1.0000 [IU] | Freq: Four times a day (QID) | 12 refills | Status: DC

## 2017-02-19 ENCOUNTER — Telehealth: Payer: Self-pay

## 2017-02-19 NOTE — Telephone Encounter (Signed)
Patient notified

## 2017-02-19 NOTE — Telephone Encounter (Signed)
-----   Message from Mora Bellman, MD sent at 02/18/2017  2:07 PM EDT ----- Please inform patient of failed 2 hour glucola and diagnosis of GDM. Please schedule appointment with diabetic educator. Testing supplies have been e-prescribed and should be brought to her education appointment  Thanks  Vickii Chafe

## 2017-03-03 ENCOUNTER — Encounter: Payer: Self-pay | Admitting: Obstetrics and Gynecology

## 2017-03-03 ENCOUNTER — Ambulatory Visit (INDEPENDENT_AMBULATORY_CARE_PROVIDER_SITE_OTHER): Payer: BC Managed Care – PPO | Admitting: Obstetrics and Gynecology

## 2017-03-03 VITALS — BP 111/75 | HR 92 | Wt 244.5 lb

## 2017-03-03 DIAGNOSIS — O24419 Gestational diabetes mellitus in pregnancy, unspecified control: Secondary | ICD-10-CM

## 2017-03-03 DIAGNOSIS — Z3480 Encounter for supervision of other normal pregnancy, unspecified trimester: Secondary | ICD-10-CM

## 2017-03-03 DIAGNOSIS — Z3483 Encounter for supervision of other normal pregnancy, third trimester: Secondary | ICD-10-CM

## 2017-03-03 DIAGNOSIS — O09523 Supervision of elderly multigravida, third trimester: Secondary | ICD-10-CM

## 2017-03-03 NOTE — Progress Notes (Signed)
Patient reports good fetal movement and contractions that come and go. Pt states that last night contractions were 1-2 minutes apart and lasted about 10 minutes, pt states today they have spaced out. Pt reports feeling pressure, denies bleeding.

## 2017-03-03 NOTE — Progress Notes (Signed)
Subjective:  Melanie Castillo is a 36 y.o. G4P3003 at [redacted]w[redacted]d being seen today for ongoing prenatal care.  She is currently monitored for the following issues for this high-risk pregnancy and has Supervision of normal pregnancy, antepartum; AMA (advanced maternal age) multigravida 35+; and Gestational diabetes mellitus (GDM) in third trimester on her problem list.  Patient reports occasional contractions.  Contractions: Irregular. Vag. Bleeding: None.  Movement: Present. Denies leaking of fluid.   The following portions of the patient's history were reviewed and updated as appropriate: allergies, current medications, past family history, past medical history, past social history, past surgical history and problem list. Problem list updated.  Objective:   Vitals:   03/03/17 1608  BP: 111/75  Pulse: 92  Weight: 244 lb 8 oz (110.9 kg)    Fetal Status: Fetal Heart Rate (bpm): 128   Movement: Present     General:  Alert, oriented and cooperative. Patient is in no acute distress.  Skin: Skin is warm and dry. No rash noted.   Cardiovascular: Normal heart rate noted  Respiratory: Normal respiratory effort, no problems with respiration noted  Abdomen: Soft, gravid, appropriate for gestational age. Pain/Pressure: Present     Pelvic:  Cervical exam performed        Extremities: Normal range of motion.  Edema: Trace  Mental Status: Normal mood and affect. Normal behavior. Normal judgment and thought content.   Urinalysis:      Assessment and Plan:  Pregnancy: G4P3003 at [redacted]w[redacted]d  1. Gestational diabetes mellitus (GDM) in third trimester, gestational diabetes method of control unspecified Did not bring readings but reports in goal range Stressed importance to bring readings to OB appts  2. Supervision of other normal pregnancy, antepartum   3. Elderly multigravida in third trimester Nl Quad screen  Preterm labor symptoms and general obstetric precautions including but not limited to vaginal  bleeding, contractions, leaking of fluid and fetal movement were reviewed in detail with the patient. Please refer to After Visit Summary for other counseling recommendations.  Return in about 2 weeks (around 03/17/2017) for OB visit.   Chancy Milroy, MD

## 2017-03-03 NOTE — Patient Instructions (Signed)
Gestational Diabetes Mellitus, Diagnosis Gestational diabetes (gestational diabetes mellitus) is a temporary form of diabetes that some women develop during pregnancy. It usually occurs around weeks 24-28 of pregnancy and goes away after delivery. Hormonal changes during pregnancy can interfere with insulin production and function, which may result in one or both of these problems:  The pancreas does not make enough of a hormone called insulin.  Cells in the body do not respond properly to insulin that the body makes (insulin resistance).  Normally, insulin allows sugars (glucose) to enter cells in the body. The cells use glucose for energy. Insulin resistance or lack of insulin causes excess glucose to build up in the blood instead of going into cells. As a result, high blood glucose (hyperglycemia) develops. What are the risks? If gestational diabetes is treated, it is unlikely to cause problems. If it is not controlled with treatment, it may cause problems during labor and delivery, and some of those problems can be harmful to the unborn baby (fetus) and the mother. Uncontrolled gestational diabetes may also cause the newborn baby to have breathing problems and low blood glucose. Women who get gestational diabetes are more likely to develop it if they get pregnant again, and they are more likely to develop type 2 diabetes in the future. What increases the risk? This condition may be more likely to develop in pregnant women who:  Are older than age 25 during pregnancy.  Have a family history of diabetes.  Are overweight.  Had gestational diabetes in the past.  Have polycystic ovarian syndrome (PCOS).  Are pregnant with twins or multiples.  Are of American-Indian, African-American, Hispanic/Latino, or Asian/Pacific Islander descent.  What are the signs or symptoms? Most women do not notice symptoms of gestational diabetes because the symptoms are similar to normal symptoms of pregnancy.  Symptoms of gestational diabetes may include:  Increased thirst (polydipsia).  Increased hunger(polyphagia).  Increased urination (polyuria).  How is this diagnosed?  This condition may be diagnosed based on your blood glucose level, which may be checked with one or more of the following blood tests:  A fasting blood glucose (FBG) test. You will not be allowed to eat (you will fast) for at least 8 hours before a blood sample is taken.  A random blood glucose test. This checks your blood glucose at any time of day regardless of when you ate.  An oral glucose tolerance test (OGTT). This is usually done during weeks 24-28 of pregnancy. ? For this test, you will have an FBG test done. Then, you will drink a beverage that contains glucose. Your blood glucose will be tested again 1 hour after drinking the glucose beverage (1-hour OGTT). ? If the 1-hour OGTT result is at or above 140 mg/dL (7.8 mmol/L), you will repeat the OGTT. This time, your blood glucose will be tested 3 hours after drinking the glucose beverage (3-hour OGTT).  If you have risk factors, you may be screened for undiagnosed type 2 diabetes at your first health care visit during your pregnancy (prenatal visit). How is this treated?  Your treatment may be managed by a specialist called an endocrinologist. This condition is treated by following instructions from your health care provider about:  Eating a healthier diet and getting more physical activity. These changes are the most important ways to manage gestational diabetes.  Checking your blood glucose. Do this as often as told.  Taking diabetes medicines or insulin every day. These will only be prescribed if they are   If you use insulin, you may need to adjust your dosage based on how physically active you are and what foods you eat. Your health care provider will tell you how to do this. Your health care provider will set treatment goals for you based on the stage  of your pregnancy and any other medical conditions you have. Generally, the goal of treatment is to maintain the following blood glucose levels during pregnancy:  Fasting: at or below 95 mg/dL (5.3 mmol/L).  After meals (postprandial):  One hour after a meal: at or below 140 mg/dL (7.8 mmol/L).  Two hours after a meal: at or below 120 mg/dL (6.7 mmol/L).  A1c (hemoglobin A1c) level: 6-6.5%. Follow these instructions at home:  Take over-the-counter and prescription medicines only as told by your health care provider.  Manage your weight gain during pregnancy. The amount of weight that you are expected to gain depends on your pre-pregnancy BMI (body mass index).  Keep all follow-up visits as told by your health care provider. This is important. Consider asking your health care provider these questions:   Do I need to meet with a diabetes educator?  Where can I find a support group for people with diabetes?  What equipment will I need to manage my diabetes at home?  What diabetes medicines do I need, and when should I take them?  How often do I need to check my blood glucose?  What number can I call if I have questions?  When is my next appointment? Where to find more information:  For more information about diabetes, visit:  American Diabetes Association (ADA): www.diabetes.org  American Association of Diabetes Educators (AADE): www.diabeteseducator.org/patient-resources Contact a health care provider if:  Your blood glucose level is at or above 240 mg/dL (13.3 mmol/L).  Your blood glucose level is at or above 200 mg/dL (11.1 mmol/L) and you have ketones in your urine.  You have been sick or have had a fever for 2 days or more and you are not getting better.  You have any of the following problems for more than 6 hours:  You cannot eat or drink.  You have nausea and vomiting.  You have diarrhea. Get help right away if:  Your blood glucose is below 54 mg/dL (3  mmol/L).  You become confused or you have trouble thinking clearly.  You have difficulty breathing.  You have moderate or large ketone levels in your urine.  Your baby is moving around less than usual.  You develop unusual discharge or bleeding from your vagina.  You start having contractions early (prematurely). Contractions may feel like a tightening in your lower abdomen. This information is not intended to replace advice given to you by your health care provider. Make sure you discuss any questions you have with your health care provider. Document Released: 01/06/2001 Document Revised: 03/07/2016 Document Reviewed: 11/03/2015 Elsevier Interactive Patient Education  2017 [employer] P[employer] The third trimester is from week 28 through week 40 (months 7 through 9). The third trimester is a time when the unborn baby (fetus) is growing rapidly. At the end of the ninth month, the fetus is about 20 inches in length and weighs 6-10 pounds. Body changes during your third trimester Your body will continue to go through many changes during pregnancy. The changes vary from woman to woman. During the third trimester:  Your weight will continue to increase. You can expect to gain 25-35 pounds (11-16 kg) by the end of the  pregnancy.  You may begin to get stretch marks on your hips, abdomen, and breasts.  You may urinate more often because the fetus is moving lower into your pelvis and pressing on your bladder.  You may develop or continue to have heartburn. This is caused by increased hormones that slow down muscles in the digestive tract.  You may develop or continue to have constipation because increased hormones slow digestion and cause the muscles that push waste through your intestines to relax.  You may develop hemorrhoids. These are swollen veins (varicose veins) in the rectum that can itch or be painful.  You may develop swollen, bulging veins (varicose veins) in  your legs.  You may have increased body aches in the pelvis, back, or thighs. This is due to weight gain and increased hormones that are relaxing your joints.  You may have changes in your hair. These can include thickening of your hair, rapid growth, and changes in texture. Some women also have hair loss during or after pregnancy, or hair that feels dry or thin. Your hair will most likely return to normal after your baby is born.  Your breasts will continue to grow and they will continue to become tender. A yellow fluid (colostrum) may leak from your breasts. This is the first milk you are producing for your baby.  Your belly button may stick out.  You may notice more swelling in your hands, face, or ankles.  You may have increased tingling or numbness in your hands, arms, and legs. The skin on your belly may also feel numb.  You may feel short of breath because of your expanding uterus.  You may have more problems sleeping. This can be caused by the size of your belly, increased need to urinate, and an increase in your body's metabolism.  You may notice the fetus "dropping," or moving lower in your abdomen (lightening).  You may have increased vaginal discharge.  You may notice your joints feel loose and you may have pain around your pelvic bone. What to expect at prenatal visits You will have prenatal exams every 2 weeks until week 36. Then you will have weekly prenatal exams. During a routine prenatal visit:  You will be weighed to make sure you and the baby are growing normally.  Your blood pressure will be taken.  Your abdomen will be measured to track your baby's growth.  The fetal heartbeat will be listened to.  Any test results from the previous visit will be discussed.  You may have a cervical check near your due date to see if your cervix has softened or thinned (effaced).  You will be tested for Group B streptococcus. This happens between 35 and 37 weeks. Your  health care provider may ask you:  What your birth plan is.  How you are feeling.  If you are feeling the baby move.  If you have had any abnormal symptoms, such as leaking fluid, bleeding, severe headaches, or abdominal cramping.  If you are using any tobacco products, including cigarettes, chewing tobacco, and electronic cigarettes.  If you have any questions. Other tests or screenings that may be performed during your third trimester include:  Blood tests that check for low iron levels (anemia).  Fetal testing to check the health, activity level, and growth of the fetus. Testing is done if you have certain medical conditions or if there are problems during the pregnancy.  Nonstress test (NST). This test checks the health of your baby to  make sure there are no signs of problems, such as the baby not getting enough oxygen. During this test, a belt is placed around your belly. The baby is made to move, and its heart rate is monitored during movement. What is false labor? False labor is a condition in which you feel small, irregular tightenings of the muscles in the womb (contractions) that usually go away with rest, changing position, or drinking water. These are called Braxton Hicks contractions. Contractions may last for hours, days, or even weeks before true labor sets in. If contractions come at regular intervals, become more frequent, increase in intensity, or become painful, you should see your health care provider. What are the signs of labor?  Abdominal cramps.  Regular contractions that start at 10 minutes apart and become stronger and more frequent with time.  Contractions that start on the top of the uterus and spread down to the lower abdomen and back.  Increased pelvic pressure and dull back pain.  A watery or bloody mucus discharge that comes from the vagina.  Leaking of amniotic fluid. This is also known as your "water breaking." It could be a slow trickle or a gush.  Let your health care provider know if it has a color or strange odor. If you have any of these signs, call your health care provider right away, even if it is before your due date. Follow these instructions at home: Medicines   Follow your health care provider's instructions regarding medicine use. Specific medicines may be either safe or unsafe to take during pregnancy.  Take a prenatal vitamin that contains at least 600 micrograms (mcg) of folic acid.  If you develop constipation, try taking a stool softener if your health care provider approves. Eating and drinking   Eat a balanced diet that includes fresh fruits and vegetables, whole grains, good sources of protein such as meat, eggs, or tofu, and low-fat dairy. Your health care provider will help you determine the amount of weight gain that is right for you.  Avoid raw meat and uncooked cheese. These carry germs that can cause birth defects in the baby.  If you have low calcium intake from food, talk to your health care provider about whether you should take a daily calcium supplement.  Eat four or five small meals rather than three large meals a day.  Limit foods that are high in fat and processed sugars, such as fried and sweet foods.  To prevent constipation:  Drink enough fluid to keep your urine clear or pale yellow.  Eat foods that are high in fiber, such as fresh fruits and vegetables, whole grains, and beans. Activity   Exercise only as directed by your health care provider. Most women can continue their usual exercise routine during pregnancy. Try to exercise for 30 minutes at least 5 days a week. Stop exercising if you experience uterine contractions.  Avoid heavy lifting.  Do not exercise in extreme heat or humidity, or at high altitudes.  Wear low-heel, comfortable shoes.  Practice good posture.  You may continue to have sex unless your health care provider tells you otherwise. Relieving pain and discomfort     Take frequent breaks and rest with your legs elevated if you have leg cramps or low back pain.  Take warm sitz baths to soothe any pain or discomfort caused by hemorrhoids. Use hemorrhoid cream if your health care provider approves.  Wear a good support bra to prevent discomfort from breast tenderness.  If  you develop varicose veins:  Wear support pantyhose or compression stockings as told by your healthcare provider.  Elevate your feet for 15 minutes, 3-4 times a day. Prenatal care   Write down your questions. Take them to your prenatal visits.  Keep all your prenatal visits as told by your health care provider. This is important. Safety   Wear your seat belt at all times when driving.  Make a list of emergency phone numbers, including numbers for family, friends, the hospital, and police and fire departments. General instructions   Avoid cat litter boxes and soil used by cats. These carry germs that can cause birth defects in the baby. If you have a cat, ask someone to clean the litter box for you.  Do not travel far distances unless it is absolutely necessary and only with the approval of your health care provider.  Do not use hot tubs, steam rooms, or saunas.  Do not drink alcohol.  Do not use any products that contain nicotine or tobacco, such as cigarettes and e-cigarettes. If you need help quitting, ask your health care provider.  Do not use any medicinal herbs or unprescribed drugs. These chemicals affect the formation and growth of the baby.  Do not douche or use tampons or scented sanitary pads.  Do not cross your legs for long periods of time.  To prepare for the arrival of your baby:  Take prenatal classes to understand, practice, and ask questions about labor and delivery.  Make a trial run to the hospital.  Visit the hospital and tour the maternity area.  Arrange for maternity or paternity leave through employers.  Arrange for family and friends to  take care of pets while you are in the hospital.  Purchase a rear-facing car seat and make sure you know how to install it in your car.  Pack your hospital bag.  Prepare the baby's nursery. Make sure to remove all pillows and stuffed animals from the baby's crib to prevent suffocation.  Visit your dentist if you have not gone during your pregnancy. Use a soft toothbrush to brush your teeth and be gentle when you floss. Contact a health care provider if:  You are unsure if you are in labor or if your water has broken.  You become dizzy.  You have mild pelvic cramps, pelvic pressure, or nagging pain in your abdominal area.  You have lower back pain.  You have persistent nausea, vomiting, or diarrhea.  You have an unusual or bad smelling vaginal discharge.  You have pain when you urinate. Get help right away if:  Your water breaks before 37 weeks.  You have regular contractions less than 5 minutes apart before 37 weeks.  You have a fever.  You are leaking fluid from your vagina.  You have spotting or bleeding from your vagina.  You have severe abdominal pain or cramping.  You have rapid weight loss or weight gain.  You have shortness of breath with chest pain.  You notice sudden or extreme swelling of your face, hands, ankles, feet, or legs.  Your baby makes fewer than 10 movements in 2 hours.  You have severe headaches that do not go away when you take medicine.  You have vision changes. Summary  The third trimester is from week 28 through week 40, months 7 through 9. The third trimester is a time when the unborn baby (fetus) is growing rapidly.  During the third trimester, your discomfort may increase as you and  your baby continue to gain weight. You may have abdominal, leg, and back pain, sleeping problems, and an increased need to urinate.  During the third trimester your breasts will keep growing and they will continue to become tender. A yellow fluid  (colostrum) may leak from your breasts. This is the first milk you are producing for your baby.  False labor is a condition in which you feel small, irregular tightenings of the muscles in the womb (contractions) that eventually go away. These are called Braxton Hicks contractions. Contractions may last for hours, days, or even weeks before true labor sets in.  Signs of labor can include: abdominal cramps; regular contractions that start at 10 minutes apart and become stronger and more frequent with time; watery or bloody mucus discharge that comes from the vagina; increased pelvic pressure and dull back pain; and leaking of amniotic fluid. This information is not intended to replace advice given to you by your health care provider. Make sure you discuss any questions you have with your health care provider. Document Released: 09/24/2001 Document Revised: 03/07/2016 Document Reviewed: 12/01/2012 Elsevier Interactive Patient Education  2017 [employer].

## 2017-03-05 ENCOUNTER — Encounter: Payer: BC Managed Care – PPO | Attending: Obstetrics and Gynecology | Admitting: Registered"

## 2017-03-05 ENCOUNTER — Encounter: Payer: Self-pay | Admitting: Registered"

## 2017-03-05 DIAGNOSIS — O24419 Gestational diabetes mellitus in pregnancy, unspecified control: Secondary | ICD-10-CM | POA: Insufficient documentation

## 2017-03-05 DIAGNOSIS — Z713 Dietary counseling and surveillance: Secondary | ICD-10-CM | POA: Diagnosis present

## 2017-03-05 DIAGNOSIS — R7309 Other abnormal glucose: Secondary | ICD-10-CM

## 2017-03-05 DIAGNOSIS — Z3A00 Weeks of gestation of pregnancy not specified: Secondary | ICD-10-CM | POA: Insufficient documentation

## 2017-03-05 NOTE — Progress Notes (Signed)
Patient was seen on 03/05/2017 for Gestational Diabetes self-management class at the [facility]. The following learning objectives were met by the patient during this course:   States the definition of Gestational Diabetes  States why dietary management is important in controlling blood glucose  Describes the effects each nutrient has on blood glucose levels  Demonstrates ability to create a balanced meal plan  Demonstrates carbohydrate counting   States when to check blood glucose levels  Demonstrates proper blood glucose monitoring techniques  States the effect of stress and exercise on blood glucose levels  States the importance of limiting caffeine and abstaining from alcohol and smoking  Blood glucose monitor given: patient has own meter Lot # n/a Exp: n/a Blood glucose reading: 122  Patient instructed to monitor glucose levels: FBS: 60 - <90 1 hour: <140 2 hour: <120  Patient received handouts:  Nutrition Diabetes and Pregnancy  Carbohydrate Counting List  Patient will be seen for follow-up as needed.

## 2017-03-16 ENCOUNTER — Inpatient Hospital Stay (HOSPITAL_COMMUNITY)
Admission: AD | Admit: 2017-03-16 | Discharge: 2017-03-16 | Disposition: A | Discharge status: Home / Self Care | Payer: BC Managed Care – PPO | Source: Ambulatory Visit | Attending: Obstetrics & Gynecology | Admitting: Obstetrics & Gynecology

## 2017-03-16 ENCOUNTER — Encounter (HOSPITAL_COMMUNITY): Payer: Self-pay

## 2017-03-16 DIAGNOSIS — O24419 Gestational diabetes mellitus in pregnancy, unspecified control: Secondary | ICD-10-CM | POA: Diagnosis not present

## 2017-03-16 DIAGNOSIS — O4703 False labor before 37 completed weeks of gestation, third trimester: Secondary | ICD-10-CM | POA: Insufficient documentation

## 2017-03-16 DIAGNOSIS — O09523 Supervision of elderly multigravida, third trimester: Secondary | ICD-10-CM | POA: Diagnosis present

## 2017-03-16 DIAGNOSIS — Z3A34 34 weeks gestation of pregnancy: Secondary | ICD-10-CM | POA: Insufficient documentation

## 2017-03-16 DIAGNOSIS — O368130 Decreased fetal movements, third trimester, not applicable or unspecified: Secondary | ICD-10-CM | POA: Insufficient documentation

## 2017-03-16 DIAGNOSIS — O479 False labor, unspecified: Secondary | ICD-10-CM

## 2017-03-16 HISTORY — DX: Gestational diabetes mellitus in pregnancy, unspecified control: O24.419

## 2017-03-16 LAB — URINALYSIS, ROUTINE W REFLEX MICROSCOPIC
BILIRUBIN URINE: NEGATIVE
Glucose, UA: NEGATIVE mg/dL
Hgb urine dipstick: NEGATIVE
KETONES UR: NEGATIVE mg/dL
Nitrite: NEGATIVE
PH: 6 (ref 5.0–8.0)
Protein, ur: NEGATIVE mg/dL
Specific Gravity, Urine: 1.024 (ref 1.005–1.030)

## 2017-03-16 NOTE — Discharge Instructions (Signed)
Braxton Hicks Contractions °Contractions of the uterus can occur throughout pregnancy, but they are not always a sign that you are in labor. You may have practice contractions called Braxton Hicks contractions. These false labor contractions are sometimes confused with true labor. °What are Braxton Hicks contractions? °Braxton Hicks contractions are tightening movements that occur in the muscles of the uterus before labor. Unlike true labor contractions, these contractions do not result in opening (dilation) and thinning of the cervix. Toward the end of pregnancy (32-34 weeks), Braxton Hicks contractions can happen more often and may become stronger. These contractions are sometimes difficult to tell apart from true labor because they can be very uncomfortable. You should not feel embarrassed if you go to the hospital with false labor. °Sometimes, the only way to tell if you are in true labor is for your health care provider to look for changes in the cervix. The health care provider will do a physical exam and may monitor your contractions. If you are not in true labor, the exam should show that your cervix is not dilating and your water has not broken. °If there are no prenatal problems or other health problems associated with your pregnancy, it is completely safe for you to be sent home with false labor. You may continue to have Braxton Hicks contractions until you go into true labor. °How can I tell the difference between true labor and false labor? °· Differences °? False labor °? Contractions last 30-70 seconds.: Contractions are usually shorter and not as strong as true labor contractions. °? Contractions become very regular.: Contractions are usually irregular. °? Discomfort is usually felt in the top of the uterus, and it spreads to the lower abdomen and low back.: Contractions are often felt in the front of the lower abdomen and in the groin. °? Contractions do not go away with walking.: Contractions may  go away when you walk around or change positions while lying down. °? Contractions usually become more intense and increase in frequency.: Contractions get weaker and are shorter-lasting as time goes on. °? The cervix dilates and gets thinner.: The cervix usually does not dilate or become thin. °Follow these instructions at home: °· Take over-the-counter and prescription medicines only as told by your health care provider. °· Keep up with your usual exercises and follow other instructions from your health care provider. °· Eat and drink lightly if you think you are going into labor. °· If Braxton Hicks contractions are making you uncomfortable: °? Change your position from lying down or resting to walking, or change from walking to resting. °? Sit and rest in a tub of warm water. °? Drink enough fluid to keep your urine clear or pale yellow. Dehydration may cause these contractions. °? Do slow and deep breathing several times an hour. °· Keep all follow-up prenatal visits as told by your health care provider. This is important. °Contact a health care provider if: °· You have a fever. °· You have continuous pain in your abdomen. °Get help right away if: °· Your contractions become stronger, more regular, and closer together. °· You have fluid leaking or gushing from your vagina. °· You pass blood-tinged mucus (bloody show). °· You have bleeding from your vagina. °· You have low back pain that you never had before. °· You feel your baby’s head pushing down and causing pelvic pressure. °· Your baby is not moving inside you as much as it used to. °Summary °· Contractions that occur before labor are   called Braxton Hicks contractions, false labor, or practice contractions.  Braxton Hicks contractions are usually shorter, weaker, farther apart, and less regular than true labor contractions. True labor contractions usually become progressively stronger and regular and they become more frequent.  Manage discomfort from  [facility] contractions by changing position, resting in a warm bath, drinking plenty of water, or practicing deep breathing. This information is not intended to replace advice given to you by your health care provider. Make sure you discuss any questions you have with your health care provider.  Fetal Movement Counts Patient Name: ________________________________________________ Patient Due Date: ____________________ What is a fetal movement count? A fetal movement count is the number of times that you feel your baby move during a certain amount of time. This may also be called a fetal kick count. A fetal movement count is recommended for every pregnant woman. You may be asked to start counting fetal movements as early as week 28 of your pregnancy. Pay attention to when your baby is most active. You may notice your baby's sleep and wake cycles. You may also notice things that make your baby move more. You should do a fetal movement count:  When your baby is normally most active.  At the same time each day.  A good time to count movements is while you are resting, after having something to eat and drink. How do I count fetal movements? 1. Find a quiet, comfortable area. Sit, or lie down on your side. 2. Write down the date, the start time and stop time, and the number of movements that you felt between those two times. Take this information with you to your health care visits. 3. For 2 hours, count kicks, flutters, swishes, rolls, and jabs. You should feel at least 10 movements during 2 hours. 4. You may stop counting after you have felt 10 movements. 5. If you do not feel 10 movements in 2 hours, have something to eat and drink. Then, keep resting and counting for 1 hour. If you feel at least 4 movements during that hour, you may stop counting. Contact a health care provider if:  You feel fewer than 4 movements in 2 hours.  Your baby is not moving like he or she usually does. Date:  ____________ Start time: ____________ Stop time: ____________ Movements: ____________ Date: ____________ Start time: ____________ Stop time: ____________ Movements: ____________ Date: ____________ Start time: ____________ Stop time: ____________ Movements: ____________ Date: ____________ Start time: ____________ Stop time: ____________ Movements: ____________ Date: ____________ Start time: ____________ Stop time: ____________ Movements: ____________ Date: ____________ Start time: ____________ Stop time: ____________ Movements: ____________ Date: ____________ Start time: ____________ Stop time: ____________ Movements: ____________ Date: ____________ Start time: ____________ Stop time: ____________ Movements: ____________ Date: ____________ Start time: ____________ Stop time: ____________ Movements: ____________ This information is not intended to replace advice given to you by your health care provider. Make sure you discuss any questions you have with your health care provider. Document Released: 10/30/2006 Document Revised: 05/29/2016 Document Reviewed: 11/09/2015 Elsevier Interactive Patient Education  [employer].

## 2017-03-16 NOTE — MAU Note (Signed)
Having lower abd pain and gets tight and then it releases, since 1pm since yesterday.  When standing, I'm hunched over cause it hurts so bad, hurts more to walk. Baby hasn't moved as much since yesterday. No bleeding. No leaking.

## 2017-03-16 NOTE — MAU Provider Note (Signed)
Chief Complaint  Patient presents with  . Decreased Fetal Movement     First Provider Initiated Contact with Patient 03/16/17 0741      S: Melanie Castillo  is a 36 y.o. y.o. year old G84P3003 female at [redacted]w[redacted]d weeks gestation who presents to MAU reporting decreased fetal movement and increased contractions.   Contractions: irreg, mild-mod Vaginal bleeding: None Leaking of fluid: None  Patient Active Problem List   Diagnosis Date Noted  . Gestational diabetes mellitus (GDM) in third trimester 02/18/2017  . Supervision of normal pregnancy, antepartum 11/27/2016  . AMA (advanced maternal age) multigravida 35+ 11/27/2016    O: Patient Vitals for the past 24 hrs:  BP Temp Temp src Pulse Resp SpO2  03/16/17 0651 117/67 98.1 F (36.7 C) Oral 84 18 100 %   General: NAD Heart: Regular rate Lungs: Normal rate and effort Abd: Soft, NT, Gravid, S=D Pelvic: NEFG, no blood.  Dilation: Closed Effacement (%): Thick Presentation: Vertex Exam by:: Manya Silvas CNM  NST performed EFM: 140, mod variability, 15x15 accels, neg decels Toco: irreg, mild Active baby throughout MAU visit.   A: [redacted]w[redacted]d week IUP Decreased fetal movement resolved with reactive NST. 1. Decreased fetal movements in third trimester, single or unspecified fetus   2. Braxton Hicks contractions      P: Discharge home in stable condition. Preterm Labor precautions and fetal kick counts. Follow-up as scheduled for prenatal visit or sooner as needed if symptoms worsen. Return to maternity admissions as needed if symptoms worsen.  Tamala Julian, [place], [address] 03/16/2017 7:50 AM  2

## 2017-03-19 ENCOUNTER — Ambulatory Visit (INDEPENDENT_AMBULATORY_CARE_PROVIDER_SITE_OTHER): Payer: BC Managed Care – PPO | Admitting: Obstetrics and Gynecology

## 2017-03-19 VITALS — BP 114/75 | HR 86 | Wt 245.2 lb

## 2017-03-19 DIAGNOSIS — O09523 Supervision of elderly multigravida, third trimester: Secondary | ICD-10-CM

## 2017-03-19 DIAGNOSIS — Z23 Encounter for immunization: Secondary | ICD-10-CM

## 2017-03-19 DIAGNOSIS — O24410 Gestational diabetes mellitus in pregnancy, diet controlled: Secondary | ICD-10-CM

## 2017-03-19 DIAGNOSIS — Z3480 Encounter for supervision of other normal pregnancy, unspecified trimester: Secondary | ICD-10-CM

## 2017-03-19 MED ORDER — GLYBURIDE 2.5 MG PO TABS: 2.5000 mg | ORAL_TABLET | Freq: Every day | ORAL | 3 refills | Status: DC

## 2017-03-19 MED ORDER — GLUCOSE BLOOD VI STRP: ORAL_STRIP | 12 refills | Status: DC

## 2017-03-19 MED ORDER — ACCU-CHEK FASTCLIX LANCETS MISC: 1.0000 [IU] | Freq: Four times a day (QID) | 12 refills | Status: DC

## 2017-03-19 NOTE — Progress Notes (Signed)
Subjective:  Melanie Castillo is a 36 y.o. G4P3003 at [redacted]w[redacted]d being seen today for ongoing prenatal care.  She is currently monitored for the following issues for this high-risk pregnancy and has Supervision of normal pregnancy, antepartum; AMA (advanced maternal age) multigravida 35+; and Gestational diabetes mellitus (GDM) in third trimester on her problem list.  Patient reports no complaints.  Contractions: Irregular. Vag. Bleeding: None.  Movement: Present. Denies leaking of fluid.   The following portions of the patient's history were reviewed and updated as appropriate: allergies, current medications, past family history, past medical history, past social history, past surgical history and problem list. Problem list updated.  Objective:   Vitals:   03/19/17 1400  BP: 114/75  Pulse: 86  Weight: 245 lb 3.2 oz (111.2 kg)    Fetal Status:     Movement: Present     General:  Alert, oriented and cooperative. Patient is in no acute distress.  Skin: Skin is warm and dry. No rash noted.   Cardiovascular: Normal heart rate noted  Respiratory: Normal respiratory effort, no problems with respiration noted  Abdomen: Soft, gravid, appropriate for gestational age. Pain/Pressure: Present     Pelvic:  Cervical exam deferred        Extremities: Normal range of motion.  Edema: Trace  Mental Status: Normal mood and affect. Normal behavior. Normal judgment and thought content.   Urinalysis:      Assessment and Plan:  Pregnancy: G4P3003 at [redacted]w[redacted]d  1. Supervision of other normal pregnancy, antepartum Stable  2. Elderly multigravida in third trimester Nl Quad scree  3. Diet controlled gestational diabetes mellitus (GDM) in third trimester BS especially fasting not in goal range Will start Diabeta  - glucose blood (ACCU-CHEK SMARTVIEW) test strip; Use as instructed to check blood sugars  Dispense: 100 each; Refill: 12 - ACCU-CHEK FASTCLIX LANCETS MISC; 1 Units by Percutaneous route 4 (four)  times daily.  Dispense: 100 each; Refill: 12 - glyBURIDE (DIABETA) 2.5 MG tablet; Take 1 tablet (2.5 mg total) by mouth at bedtime.  Dispense: 60 tablet; Refill: 3  Preterm labor symptoms and general obstetric precautions including but not limited to vaginal bleeding, contractions, leaking of fluid and fetal movement were reviewed in detail with the patient. Please refer to After Visit Summary for other counseling recommendations.  Return in about 1 week (around 03/26/2017) for OB visit.   Chancy Milroy, MD

## 2017-03-19 NOTE — Addendum Note (Signed)
Addended by: Maryruth Eve on: 03/19/2017 02:44 PM   Modules accepted: Orders

## 2017-03-19 NOTE — Progress Notes (Deleted)
ROB request

## 2017-03-26 ENCOUNTER — Encounter: Payer: BC Managed Care – PPO | Admitting: Obstetrics & Gynecology

## 2017-03-31 ENCOUNTER — Other Ambulatory Visit (HOSPITAL_COMMUNITY)
Admission: RE | Admit: 2017-03-31 | Discharge: 2017-03-31 | Disposition: A | Discharge status: Home / Self Care | Payer: BC Managed Care – PPO | Source: Ambulatory Visit | Attending: Obstetrics and Gynecology | Admitting: Obstetrics and Gynecology

## 2017-03-31 ENCOUNTER — Ambulatory Visit (INDEPENDENT_AMBULATORY_CARE_PROVIDER_SITE_OTHER): Payer: BC Managed Care – PPO | Admitting: Obstetrics and Gynecology

## 2017-03-31 VITALS — BP 126/77 | HR 102 | Wt 242.0 lb

## 2017-03-31 DIAGNOSIS — Z3480 Encounter for supervision of other normal pregnancy, unspecified trimester: Secondary | ICD-10-CM | POA: Diagnosis not present

## 2017-03-31 DIAGNOSIS — O09523 Supervision of elderly multigravida, third trimester: Secondary | ICD-10-CM

## 2017-03-31 DIAGNOSIS — O24419 Gestational diabetes mellitus in pregnancy, unspecified control: Secondary | ICD-10-CM

## 2017-03-31 DIAGNOSIS — Z113 Encounter for screening for infections with a predominantly sexual mode of transmission: Secondary | ICD-10-CM | POA: Diagnosis not present

## 2017-03-31 DIAGNOSIS — Z3483 Encounter for supervision of other normal pregnancy, third trimester: Secondary | ICD-10-CM

## 2017-03-31 DIAGNOSIS — O24410 Gestational diabetes mellitus in pregnancy, diet controlled: Secondary | ICD-10-CM

## 2017-03-31 NOTE — Progress Notes (Signed)
   PRENATAL VISIT NOTE  Subjective:  Melanie Castillo is a 36 y.o. G4P3003 at [redacted]w[redacted]d being seen today for ongoing prenatal care.  She is currently monitored for the following issues for this high-risk pregnancy and has Supervision of normal pregnancy, antepartum; AMA (advanced maternal age) multigravida 35+; and Gestational diabetes mellitus (GDM) in third trimester on her problem list.  Patient reports no complaints.  Contractions: Irritability. Vag. Bleeding: None.  Movement: Present. Denies leaking of fluid.   The following portions of the patient's history were reviewed and updated as appropriate: allergies, current medications, past family history, past medical history, past social history, past surgical history and problem list. Problem list updated.  Objective:   Vitals:   03/31/17 0859  BP: 126/77  Pulse: (!) 102  Weight: 242 lb (109.8 kg)    Fetal Status: Fetal Heart Rate (bpm): NST Fundal Height: 37 cm Movement: Present  Presentation: Vertex  General:  Alert, oriented and cooperative. Patient is in no acute distress.  Skin: Skin is warm and dry. No rash noted.   Cardiovascular: Normal heart rate noted  Respiratory: Normal respiratory effort, no problems with respiration noted  Abdomen: Soft, gravid, appropriate for gestational age. Pain/Pressure: Present     Pelvic:  Cervical exam performed Dilation: 1 Effacement (%): 30 Station: Ballotable  Extremities: Normal range of motion.  Edema: None  Mental Status: Normal mood and affect. Normal behavior. Normal judgment and thought content.   Assessment and Plan:  Pregnancy: G4P3003 at [redacted]w[redacted]d  1. Supervision of other normal pregnancy, antepartum Patient is doing well without complaints Cultures today - Strep Gp B NAA - Cervicovaginal ancillary only  2. Diet controlled gestational diabetes mellitus (GDM) in third trimester CBGs reviewed on glyburide and all fasting within range. 2 pp values as high as 162 and patient admits to  eating sugary item, rest of pp within range Growth ultrasound ordered NST reviewed and reactive with baseline 145, mod variability, + accels, no decels NST with AFI later this week Patient will be scheduled for IOL at 39 weeks - [place] MFM OB FOLLOW UP; Future  3. Elderly multigravida in third trimester Normal quad screed  Preterm labor symptoms and general obstetric precautions including but not limited to vaginal bleeding, contractions, leaking of fluid and fetal movement were reviewed in detail with the patient. Please refer to After Visit Summary for other counseling recommendations.  Return in about 1 week (around 04/07/2017) for ROB, NST.   Mora Bellman, MD

## 2017-04-01 ENCOUNTER — Encounter (HOSPITAL_COMMUNITY): Payer: Self-pay

## 2017-04-01 ENCOUNTER — Ambulatory Visit (HOSPITAL_COMMUNITY)
Admission: RE | Admit: 2017-04-01 | Discharge: 2017-04-01 | Disposition: A | Discharge status: Home / Self Care | Payer: BC Managed Care – PPO | Source: Ambulatory Visit | Attending: Obstetrics and Gynecology | Admitting: Obstetrics and Gynecology

## 2017-04-01 DIAGNOSIS — O24415 Gestational diabetes mellitus in pregnancy, controlled by oral hypoglycemic drugs: Secondary | ICD-10-CM | POA: Diagnosis not present

## 2017-04-01 DIAGNOSIS — O24410 Gestational diabetes mellitus in pregnancy, diet controlled: Secondary | ICD-10-CM | POA: Diagnosis present

## 2017-04-01 DIAGNOSIS — Z3A36 36 weeks gestation of pregnancy: Secondary | ICD-10-CM | POA: Diagnosis not present

## 2017-04-01 LAB — CERVICOVAGINAL ANCILLARY ONLY
Chlamydia: NEGATIVE
Neisseria Gonorrhea: NEGATIVE

## 2017-04-02 ENCOUNTER — Encounter: Payer: Self-pay | Admitting: Obstetrics and Gynecology

## 2017-04-02 ENCOUNTER — Encounter: Payer: BC Managed Care – PPO | Admitting: Obstetrics and Gynecology

## 2017-04-02 DIAGNOSIS — O99820 Streptococcus B carrier state complicating pregnancy: Secondary | ICD-10-CM | POA: Insufficient documentation

## 2017-04-02 LAB — STREP GP B NAA: STREP GROUP B AG: POSITIVE — AB

## 2017-04-03 ENCOUNTER — Encounter (HOSPITAL_COMMUNITY): Payer: Self-pay | Admitting: *Deleted

## 2017-04-03 ENCOUNTER — Ambulatory Visit (INDEPENDENT_AMBULATORY_CARE_PROVIDER_SITE_OTHER): Payer: BC Managed Care – PPO

## 2017-04-03 ENCOUNTER — Telehealth (HOSPITAL_COMMUNITY): Payer: Self-pay | Admitting: *Deleted

## 2017-04-03 VITALS — BP 122/80 | HR 94 | Wt 243.0 lb

## 2017-04-03 DIAGNOSIS — O24419 Gestational diabetes mellitus in pregnancy, unspecified control: Secondary | ICD-10-CM

## 2017-04-03 NOTE — Progress Notes (Signed)
Nurse visit for NST  AFI completed yesterday at [facility].

## 2017-04-03 NOTE — Telephone Encounter (Signed)
Preadmission screen  

## 2017-04-04 MED ORDER — HYDROXYPROGESTERONE CAPROATE 250 MG/ML IM OIL: 250.0000 mg | TOPICAL_OIL | Freq: Once | INTRAMUSCULAR | Status: DC

## 2017-04-04 NOTE — Addendum Note (Signed)
Addended by: Maryruth Eve on: 04/04/2017 09:53 AM   Modules accepted: Orders

## 2017-04-04 NOTE — Addendum Note (Signed)
Addended by: Maryruth Eve on: 04/04/2017 10:42 AM   Modules accepted: Orders

## 2017-04-06 NOTE — Addendum Note (Signed)
Addended by: Gretchen Short on: 04/06/2017 08:32 AM   Modules accepted: Orders

## 2017-04-07 ENCOUNTER — Ambulatory Visit (INDEPENDENT_AMBULATORY_CARE_PROVIDER_SITE_OTHER): Payer: BC Managed Care – PPO | Admitting: Obstetrics and Gynecology

## 2017-04-07 ENCOUNTER — Ambulatory Visit (HOSPITAL_COMMUNITY): Payer: BC Managed Care – PPO

## 2017-04-07 VITALS — BP 114/76 | HR 89 | Wt 243.4 lb

## 2017-04-07 DIAGNOSIS — O99820 Streptococcus B carrier state complicating pregnancy: Secondary | ICD-10-CM

## 2017-04-07 DIAGNOSIS — O24415 Gestational diabetes mellitus in pregnancy, controlled by oral hypoglycemic drugs: Secondary | ICD-10-CM

## 2017-04-07 NOTE — Progress Notes (Signed)
6/25 NST reviewed and reactive with baseline 120, mod variability,+ accels, no decels

## 2017-04-09 ENCOUNTER — Encounter: Payer: BC Managed Care – PPO | Admitting: Obstetrics and Gynecology

## 2017-04-10 ENCOUNTER — Ambulatory Visit (INDEPENDENT_AMBULATORY_CARE_PROVIDER_SITE_OTHER): Payer: BC Managed Care – PPO | Admitting: Obstetrics and Gynecology

## 2017-04-10 VITALS — BP 118/79 | HR 79 | Wt 238.0 lb

## 2017-04-10 DIAGNOSIS — O24415 Gestational diabetes mellitus in pregnancy, controlled by oral hypoglycemic drugs: Secondary | ICD-10-CM

## 2017-04-10 DIAGNOSIS — O99820 Streptococcus B carrier state complicating pregnancy: Secondary | ICD-10-CM

## 2017-04-10 DIAGNOSIS — Z3480 Encounter for supervision of other normal pregnancy, unspecified trimester: Secondary | ICD-10-CM

## 2017-04-10 DIAGNOSIS — O09523 Supervision of elderly multigravida, third trimester: Secondary | ICD-10-CM

## 2017-04-10 NOTE — Progress Notes (Signed)
   PRENATAL VISIT NOTE  Subjective:  Melanie Castillo is a 36 y.o. G4P3003 at [redacted]w[redacted]d being seen today for ongoing prenatal care.  She is currently monitored for the following issues for this high-risk pregnancy and has Supervision of normal pregnancy, antepartum; AMA (advanced maternal age) multigravida 61+; Gestational diabetes mellitus (GDM) in third trimester; and GBS (group B Streptococcus carrier), +RV culture, currently pregnant on her problem list.  Patient reports no complaints.  Contractions: Irregular. Vag. Bleeding: None.  Movement: Present. Denies leaking of fluid.   The following portions of the patient's history were reviewed and updated as appropriate: allergies, current medications, past family history, past medical history, past social history, past surgical history and problem list. Problem list updated.  Objective:   Vitals:   04/10/17 0934  BP: 118/79  Pulse: 79  Weight: 238 lb (108 kg)    Fetal Status: Fetal Heart Rate (bpm): NST/AFI   Movement: Present  Presentation: Vertex  General:  Alert, oriented and cooperative. Patient is in no acute distress.  Skin: Skin is warm and dry. No rash noted.   Cardiovascular: Normal heart rate noted  Respiratory: Normal respiratory effort, no problems with respiration noted  Abdomen: Soft, gravid, appropriate for gestational age. Pain/Pressure: Present     Pelvic:  Cervical exam deferred        Extremities: Normal range of motion.  Edema: None  Mental Status: Normal mood and affect. Normal behavior. Normal judgment and thought content.   Assessment and Plan:  Pregnancy: G4P3003 at [redacted]w[redacted]d  1. Gestational diabetes mellitus (GDM) in third trimester controlled on oral hypoglycemic drug Patient did not bring CBG but reports all values within range as long as she adheres to the diet. She sometimes indulges into her sweet cravings Continue glyburide Scheduled for IOL at 39 weeks - Fetal nonstress test; Future- NST reviewed and  reactive with baseline 150, mod variability, +accels, no decels - Amniotic fluid index- Pt informed that the ultrasound is considered a limited OB ultrasound and is not intended to be a complete ultrasound exam.  Patient also informed that the ultrasound is not being completed with the intent of assessing for fetal or placental anomalies or any pelvic abnormalities.  Explained that the purpose of today's ultrasound is to assess for  presentation and AFI with fetus in cephalic presentation and AFI 11.18.  Patient acknowledges the purpose of the exam and the limitations of the study.    2. Elderly multigravida in third trimester   3. Supervision of other normal pregnancy, antepartum Patient is doing well Answered questions regarding IOL Patient is now considering BTL. She understands that it will done be done at the time of delivery as the medicaid consent form has not been signed. She opted to wait until postpartum and remains undecided on contraception  4. GBS (group B Streptococcus carrier), +RV culture, currently pregnant Prophylaxis in labor  Term labor symptoms and general obstetric precautions including but not limited to vaginal bleeding, contractions, leaking of fluid and fetal movement were reviewed in detail with the patient. Please refer to After Visit Summary for other counseling recommendations.  Return in about 4 days (around 04/14/2017) for NST only!!!.   Mora Bellman, MD

## 2017-04-13 ENCOUNTER — Inpatient Hospital Stay (HOSPITAL_COMMUNITY): Payer: BC Managed Care – PPO | Admitting: Anesthesiology

## 2017-04-13 ENCOUNTER — Inpatient Hospital Stay (HOSPITAL_COMMUNITY)
Admission: AD | Admit: 2017-04-13 | Discharge: 2017-04-15 | DRG: 775 | Disposition: A | Discharge status: Home / Self Care | Payer: BC Managed Care – PPO | Source: Ambulatory Visit | Attending: Obstetrics & Gynecology | Admitting: Obstetrics & Gynecology

## 2017-04-13 ENCOUNTER — Encounter (HOSPITAL_COMMUNITY): Payer: Self-pay

## 2017-04-13 DIAGNOSIS — Z3A38 38 weeks gestation of pregnancy: Secondary | ICD-10-CM

## 2017-04-13 DIAGNOSIS — O99824 Streptococcus B carrier state complicating childbirth: Secondary | ICD-10-CM | POA: Diagnosis present

## 2017-04-13 DIAGNOSIS — Z6841 Body Mass Index (BMI) 40.0 and over, adult: Secondary | ICD-10-CM | POA: Diagnosis not present

## 2017-04-13 DIAGNOSIS — O99820 Streptococcus B carrier state complicating pregnancy: Secondary | ICD-10-CM

## 2017-04-13 DIAGNOSIS — O24425 Gestational diabetes mellitus in childbirth, controlled by oral hypoglycemic drugs: Principal | ICD-10-CM | POA: Diagnosis present

## 2017-04-13 DIAGNOSIS — O99214 Obesity complicating childbirth: Secondary | ICD-10-CM | POA: Diagnosis present

## 2017-04-13 DIAGNOSIS — O24415 Gestational diabetes mellitus in pregnancy, controlled by oral hypoglycemic drugs: Secondary | ICD-10-CM | POA: Diagnosis present

## 2017-04-13 DIAGNOSIS — Z3493 Encounter for supervision of normal pregnancy, unspecified, third trimester: Secondary | ICD-10-CM | POA: Diagnosis present

## 2017-04-13 LAB — TYPE AND SCREEN
ABO/RH(D): B POS
ANTIBODY SCREEN: NEGATIVE

## 2017-04-13 LAB — CBC
HEMATOCRIT: 36.4 % (ref 36.0–46.0)
HEMOGLOBIN: 12.3 g/dL (ref 12.0–15.0)
MCH: 24.8 pg — AB (ref 26.0–34.0)
MCHC: 33.8 g/dL (ref 30.0–36.0)
MCV: 73.5 fL — ABNORMAL LOW (ref 78.0–100.0)
Platelets: 190 10*3/uL (ref 150–400)
RBC: 4.95 MIL/uL (ref 3.87–5.11)
RDW: 15.5 % (ref 11.5–15.5)
WBC: 12.5 10*3/uL — ABNORMAL HIGH (ref 4.0–10.5)

## 2017-04-13 LAB — GLUCOSE, CAPILLARY: Glucose-Capillary: 79 mg/dL (ref 65–99)

## 2017-04-13 LAB — ABO/RH: ABO/RH(D): B POS

## 2017-04-13 MED ORDER — PRENATAL MULTIVITAMIN CH
1.0000 | ORAL_TABLET | Freq: Every day | ORAL | Status: DC
  Administered 2017-04-14 – 2017-04-15 (×2): 1 via ORAL
  Filled 2017-04-13 (×2): qty 1

## 2017-04-13 MED ORDER — FLEET ENEMA 7-19 GM/118ML RE ENEM: 1.0000 | ENEMA | RECTAL | Status: DC | PRN

## 2017-04-13 MED ORDER — LACTATED RINGERS IV SOLN
INTRAVENOUS | Status: DC
  Administered 2017-04-13: 15:00:00 via INTRAVENOUS

## 2017-04-13 MED ORDER — DEXTROSE 5 % IV SOLN
5.0000 10*6.[IU] | Freq: Once | INTRAVENOUS | Status: DC
  Filled 2017-04-13: qty 5

## 2017-04-13 MED ORDER — DIBUCAINE 1 % RE OINT
1.0000 "application " | TOPICAL_OINTMENT | RECTAL | Status: DC | PRN
  Administered 2017-04-13: 1 via RECTAL
  Filled 2017-04-13: qty 28

## 2017-04-13 MED ORDER — ZOLPIDEM TARTRATE 5 MG PO TABS: 5.0000 mg | ORAL_TABLET | Freq: Every evening | ORAL | Status: DC | PRN

## 2017-04-13 MED ORDER — OXYCODONE-ACETAMINOPHEN 5-325 MG PO TABS: 2.0000 | ORAL_TABLET | ORAL | Status: DC | PRN

## 2017-04-13 MED ORDER — SODIUM CHLORIDE 0.9% FLUSH: 3.0000 mL | INTRAVENOUS | Status: DC | PRN

## 2017-04-13 MED ORDER — PENICILLIN G POT IN DEXTROSE 60000 UNIT/ML IV SOLN
3.0000 10*6.[IU] | INTRAVENOUS | Status: DC
  Filled 2017-04-13: qty 50

## 2017-04-13 MED ORDER — OXYTOCIN BOLUS FROM INFUSION: 500.0000 mL | Freq: Once | INTRAVENOUS | Status: DC

## 2017-04-13 MED ORDER — COCONUT OIL OIL
1.0000 "application " | TOPICAL_OIL | Status: DC | PRN
  Administered 2017-04-14: 1 via TOPICAL
  Filled 2017-04-13: qty 120

## 2017-04-13 MED ORDER — LIDOCAINE HCL (PF) 1 % IJ SOLN: 30.0000 mL | INTRAMUSCULAR | Status: DC | PRN

## 2017-04-13 MED ORDER — SENNOSIDES-DOCUSATE SODIUM 8.6-50 MG PO TABS
2.0000 | ORAL_TABLET | ORAL | Status: DC
  Administered 2017-04-13 – 2017-04-14 (×2): 2 via ORAL
  Filled 2017-04-13 (×2): qty 2

## 2017-04-13 MED ORDER — AMPICILLIN SODIUM 2 G IJ SOLR
2.0000 g | Freq: Once | INTRAMUSCULAR | Status: AC
  Administered 2017-04-13: 2 g via INTRAVENOUS
  Filled 2017-04-13: qty 2000

## 2017-04-13 MED ORDER — LACTATED RINGERS IV BOLUS (SEPSIS)
1000.0000 mL | Freq: Once | INTRAVENOUS | Status: AC
  Administered 2017-04-13: 1000 mL via INTRAVENOUS

## 2017-04-13 MED ORDER — LACTATED RINGERS IV SOLN: 500.0000 mL | INTRAVENOUS | Status: DC | PRN

## 2017-04-13 MED ORDER — SODIUM CHLORIDE 0.9 % IV SOLN: 250.0000 mL | INTRAVENOUS | Status: DC | PRN

## 2017-04-13 MED ORDER — DIPHENHYDRAMINE HCL 25 MG PO CAPS: 25.0000 mg | ORAL_CAPSULE | Freq: Four times a day (QID) | ORAL | Status: DC | PRN

## 2017-04-13 MED ORDER — VITAMIN K1 1 MG/0.5ML IJ SOLN
INTRAMUSCULAR | Status: AC
  Filled 2017-04-13: qty 0.5

## 2017-04-13 MED ORDER — ONDANSETRON HCL 4 MG/2ML IJ SOLN: 4.0000 mg | Freq: Four times a day (QID) | INTRAMUSCULAR | Status: DC | PRN

## 2017-04-13 MED ORDER — OXYTOCIN BOLUS FROM INFUSION
500.0000 mL | Freq: Once | INTRAVENOUS | Status: AC
  Administered 2017-04-13: 500 mL via INTRAVENOUS

## 2017-04-13 MED ORDER — OXYCODONE-ACETAMINOPHEN 5-325 MG PO TABS: 1.0000 | ORAL_TABLET | ORAL | Status: DC | PRN

## 2017-04-13 MED ORDER — PHENYLEPHRINE 40 MCG/ML (10ML) SYRINGE FOR IV PUSH (FOR BLOOD PRESSURE SUPPORT)
80.0000 ug | PREFILLED_SYRINGE | INTRAVENOUS | Status: DC | PRN
  Filled 2017-04-13: qty 5
  Filled 2017-04-13: qty 10

## 2017-04-13 MED ORDER — ACETAMINOPHEN 325 MG PO TABS
650.0000 mg | ORAL_TABLET | ORAL | Status: DC | PRN
  Administered 2017-04-13: 650 mg via ORAL
  Filled 2017-04-13: qty 2

## 2017-04-13 MED ORDER — LIDOCAINE HCL (PF) 1 % IJ SOLN
INTRAMUSCULAR | Status: DC | PRN
  Administered 2017-04-13 (×2): 5 mL

## 2017-04-13 MED ORDER — EPHEDRINE 5 MG/ML INJ
10.0000 mg | INTRAVENOUS | Status: DC | PRN
  Filled 2017-04-13: qty 2

## 2017-04-13 MED ORDER — PHENYLEPHRINE 40 MCG/ML (10ML) SYRINGE FOR IV PUSH (FOR BLOOD PRESSURE SUPPORT)
80.0000 ug | PREFILLED_SYRINGE | INTRAVENOUS | Status: DC | PRN
  Filled 2017-04-13: qty 5

## 2017-04-13 MED ORDER — ACETAMINOPHEN 325 MG PO TABS: 650.0000 mg | ORAL_TABLET | ORAL | Status: DC | PRN

## 2017-04-13 MED ORDER — WITCH HAZEL-GLYCERIN EX PADS: 1.0000 "application " | MEDICATED_PAD | CUTANEOUS | Status: DC | PRN

## 2017-04-13 MED ORDER — ONDANSETRON HCL 4 MG PO TABS: 4.0000 mg | ORAL_TABLET | ORAL | Status: DC | PRN

## 2017-04-13 MED ORDER — SOD CITRATE-CITRIC ACID 500-334 MG/5ML PO SOLN: 30.0000 mL | ORAL | Status: DC | PRN

## 2017-04-13 MED ORDER — OXYTOCIN 40 UNITS IN LACTATED RINGERS INFUSION - SIMPLE MED
2.5000 [IU]/h | INTRAVENOUS | Status: DC
  Administered 2017-04-13: 2.5 [IU]/h via INTRAVENOUS
  Filled 2017-04-13: qty 1000

## 2017-04-13 MED ORDER — OXYTOCIN 40 UNITS IN LACTATED RINGERS INFUSION - SIMPLE MED: 2.5000 [IU]/h | INTRAVENOUS | Status: DC

## 2017-04-13 MED ORDER — LIDOCAINE HCL (PF) 1 % IJ SOLN
30.0000 mL | INTRAMUSCULAR | Status: DC | PRN
  Filled 2017-04-13: qty 30

## 2017-04-13 MED ORDER — SODIUM CHLORIDE 0.9% FLUSH: 3.0000 mL | Freq: Two times a day (BID) | INTRAVENOUS | Status: DC

## 2017-04-13 MED ORDER — SIMETHICONE 80 MG PO CHEW: 80.0000 mg | CHEWABLE_TABLET | ORAL | Status: DC | PRN

## 2017-04-13 MED ORDER — LACTATED RINGERS IV SOLN: INTRAVENOUS | Status: DC

## 2017-04-13 MED ORDER — LACTATED RINGERS IV SOLN
500.0000 mL | Freq: Once | INTRAVENOUS | Status: AC
  Administered 2017-04-13: 500 mL via INTRAVENOUS

## 2017-04-13 MED ORDER — DIPHENHYDRAMINE HCL 50 MG/ML IJ SOLN: 12.5000 mg | INTRAMUSCULAR | Status: DC | PRN

## 2017-04-13 MED ORDER — IBUPROFEN 600 MG PO TABS
600.0000 mg | ORAL_TABLET | Freq: Four times a day (QID) | ORAL | Status: DC
  Administered 2017-04-13 – 2017-04-15 (×7): 600 mg via ORAL
  Filled 2017-04-13 (×7): qty 1

## 2017-04-13 MED ORDER — FENTANYL CITRATE (PF) 100 MCG/2ML IJ SOLN: 100.0000 ug | INTRAMUSCULAR | Status: DC | PRN

## 2017-04-13 MED ORDER — BENZOCAINE-MENTHOL 20-0.5 % EX AERO: 1.0000 "application " | INHALATION_SPRAY | CUTANEOUS | Status: DC | PRN

## 2017-04-13 MED ORDER — FENTANYL 2.5 MCG/ML BUPIVACAINE 1/10 % EPIDURAL INFUSION (WH - ANES)
14.0000 mL/h | INTRAMUSCULAR | Status: DC | PRN
  Administered 2017-04-13: 14 mL/h via EPIDURAL
  Filled 2017-04-13: qty 100

## 2017-04-13 MED ORDER — MEASLES, MUMPS & RUBELLA VAC ~~LOC~~ INJ
0.5000 mL | INJECTION | Freq: Once | SUBCUTANEOUS | Status: DC
  Filled 2017-04-13: qty 0.5

## 2017-04-13 MED ORDER — TETANUS-DIPHTH-ACELL PERTUSSIS 5-2.5-18.5 LF-MCG/0.5 IM SUSP: 0.5000 mL | Freq: Once | INTRAMUSCULAR | Status: DC

## 2017-04-13 MED ORDER — ONDANSETRON HCL 4 MG/2ML IJ SOLN: 4.0000 mg | INTRAMUSCULAR | Status: DC | PRN

## 2017-04-13 NOTE — Progress Notes (Signed)
Melanie Castillo is a 36 y.o. G4P3003 at [redacted]w[redacted]d by ultrasound admitted for active labor  Subjective:   Objective: BP (!) 113/59   Pulse 88   Temp 98.4 F (36.9 C) (Oral)   Resp 18   Ht 5\' 4"  (1.626 m)   Wt 238 lb (108 kg)   LMP 07/17/2016 (Exact Date)   SpO2 100%   BMI 40.85 kg/m  No intake/output data recorded. No intake/output data recorded.  FHT:  FHR: 135 bpm, variability: moderate,  accelerations:  Present,  decelerations:  Absent UC:   regular, every 4-5 minutes SVE:   Dilation: 6 Effacement (%): 80 Station: -2 Exam by:: katherine g jones RN   Labs: Lab Results  Component Value Date   WBC 12.5 (H) 04/13/2017   HGB 12.3 04/13/2017   HCT 36.4 04/13/2017   MCV 73.5 (L) 04/13/2017   PLT 190 04/13/2017    Assessment / Plan: Spontaneous labor, progressing normally  Labor: Progressing normally Preeclampsia:  no signs or symptoms of toxicity Fetal Wellbeing:  Category I Pain Control:  Epidural I/D:  n/a Anticipated MOD:  NSVD  Koren Shiver 04/13/2017, 5:14 PM

## 2017-04-13 NOTE — H&P (Signed)
Melanie Castillo is a 36 y.o. female [phone] @ 38.4wks  presenting for active labor. OB History    Gravida Para Term Preterm AB Living   4 3 3     3    SAB TAB Ectopic Multiple Live Births           3     Past Medical History:  Diagnosis Date  . Gestational diabetes    glyburide  . Medical history non-contributory    Past Surgical History:  Procedure Laterality Date  . NO PAST SURGERIES     Family History: family history includes Colon cancer in her father; Diabetes in her mother; Hypertension in her mother; Thyroid disease in her mother. Social History:  reports that she has never smoked. She has never used smokeless tobacco. She reports that she drinks alcohol. She reports that she does not use drugs.     Maternal Diabetes: Yes:  Diabetes Type:  Insulin/Medication controlled Genetic Screening: Normal Maternal Ultrasounds/Referrals: Normal Fetal Ultrasounds or other Referrals:  None Maternal Substance Abuse:  No Significant Maternal Medications:  None Significant Maternal Lab Results:  Lab values include: Group B Strep positive Other Comments:  None  Review of Systems  Constitutional: Negative.   HENT: Negative.   Eyes: Negative.   Respiratory: Negative.   Cardiovascular: Negative.   Gastrointestinal: Positive for abdominal pain.  Genitourinary: Negative.   Musculoskeletal: Negative.   Skin: Negative.   Neurological: Negative.   Endo/Heme/Allergies: Negative.   Psychiatric/Behavioral: Negative.    Maternal Medical History:  Reason for admission: Contractions.   Contractions: Onset was 6-12 hours ago.   Frequency: regular.   Perceived severity is moderate.    Fetal activity: Perceived fetal activity is normal.   Last perceived fetal movement was within the past hour.    Prenatal Complications - Diabetes: gestational. Diabetes is managed by oral agent (dual therapy).      Dilation: 6 Effacement (%): 80 Station: -2 Exam by:: Darlene CNM Blood pressure  116/90, pulse 95, temperature 98.4 F (36.9 C), temperature source Oral, resp. rate 20, height 5\' 4"  (1.626 m), weight 238 lb (108 kg), last menstrual period 07/17/2016. Maternal Exam:  Uterine Assessment: Contraction strength is moderate.  Contraction frequency is regular.   Abdomen: Patient reports no abdominal tenderness. Fetal presentation: vertex  Introitus: Normal vulva. Normal vagina.  Ferning test: not done.  Nitrazine test: not done. Amniotic fluid character: not assessed.  Pelvis: adequate for delivery.   Cervix: Cervix evaluated by digital exam.     Fetal Exam Fetal Monitor Review: Mode: ultrasound.   Variability: moderate (6-25 bpm).   Pattern: accelerations present and no decelerations.    Fetal State Assessment: Category I - tracings are normal.     Physical Exam  Constitutional: She is oriented to person, place, and time. She appears well-developed and well-nourished.  HENT:  Head: Normocephalic.  Eyes: Pupils are equal, round, and reactive to light.  Neck: Normal range of motion.  Cardiovascular: Normal rate, regular rhythm, normal heart sounds and intact distal pulses.   Respiratory: Effort normal and breath sounds normal.  GI: Soft. Bowel sounds are normal.  Genitourinary: Vagina normal and uterus normal.  Musculoskeletal: Normal range of motion.  Neurological: She is alert and oriented to person, place, and time. She has normal reflexes.  Skin: Skin is warm and dry.  Psychiatric: She has a normal mood and affect. Her behavior is normal. Judgment and thought content normal.    Prenatal labs: ABO, Rh: B/Positive/-- (11/20 0000) Antibody:  Negative (11/20 0000) Rubella: Immune (11/20 0000) RPR: Non Reactive (05/07 1038)  HBsAg: Negative (03/03 1230)  HIV: Non Reactive (05/07 1038)  GBS: Positive (06/18 1007)   Assessment/Plan: SVE 6/70/-2. Pos GBS, admit anticipate SVD.    Koren Shiver 04/13/2017, 3:50 PM

## 2017-04-13 NOTE — MAU Note (Signed)
Notified DKellie Simmering CNM patient G4P3 [redacted]w[redacted]d 6 cm GBS positive, history of rapid labors.

## 2017-04-13 NOTE — Progress Notes (Addendum)
G4P3 @ 38.[redacted] wksga. Presents to triage for contractions that started  Denies LOF or bleeding. + FM.   1455: EFM applied.  1500: SVE: 6/80/BB  Hx: precipitous delivery 74 mins from entering triage to delivery.   GBS+  1510: Labs and IV started.   1515: To Birthing suite via wheelchair.

## 2017-04-13 NOTE — Anesthesia Preprocedure Evaluation (Signed)
Anesthesia Evaluation  Patient identified by MRN, date of birth, ID band Patient awake    Reviewed: Allergy & Precautions, H&P , NPO status , Patient's Chart, lab work & pertinent test results  History of Anesthesia Complications Negative for: history of anesthetic complications  Airway Mallampati: II  TM Distance: >3 FB Neck ROM: full    Dental no notable dental hx. (+) Teeth Intact   Pulmonary neg pulmonary ROS,    Pulmonary exam normal breath sounds clear to auscultation       Cardiovascular negative cardio ROS Normal cardiovascular exam Rhythm:regular Rate:Normal     Neuro/Psych negative neurological ROS  negative psych ROS   GI/Hepatic negative GI ROS, Neg liver ROS,   Endo/Other  diabetes, GestationalMorbid obesity  Renal/GU negative Renal ROS  negative genitourinary   Musculoskeletal   Abdominal   Peds  Hematology negative hematology ROS (+)   Anesthesia Other Findings   Reproductive/Obstetrics (+) Pregnancy                             Anesthesia Physical Anesthesia Plan  ASA: III  Anesthesia Plan: Epidural   Post-op Pain Management:    Induction:   PONV Risk Score and Plan:   Airway Management Planned:   Additional Equipment:   Intra-op Plan:   Post-operative Plan:   Informed Consent: I have reviewed the patients History and Physical, chart, labs and discussed the procedure including the risks, benefits and alternatives for the proposed anesthesia with the patient or authorized representative who has indicated his/her understanding and acceptance.     Plan Discussed with:   Anesthesia Plan Comments:         Anesthesia Quick Evaluation

## 2017-04-13 NOTE — Anesthesia Procedure Notes (Signed)
Epidural Patient location during procedure: OB  Staffing Anesthesiologist: Montez Hageman Performed: anesthesiologist   Preanesthetic Checklist Completed: patient identified, site marked, surgical consent, pre-op evaluation, timeout performed, IV checked, risks and benefits discussed and monitors and equipment checked  Epidural Patient position: sitting Prep: DuraPrep Patient monitoring: heart rate, continuous pulse ox and blood pressure Approach: right paramedian Location: L3-L4 Injection technique: LOR saline  Needle:  Needle type: Tuohy  Needle gauge: 17 G Needle length: 9 cm and 9 Needle insertion depth: 8 cm Catheter type: closed end flexible Catheter size: 20 Guage Catheter at skin depth: 12 cm Test dose: negative  Assessment Events: blood not aspirated, injection not painful, no injection resistance, negative IV test and no paresthesia  Additional Notes Patient identified. Risks/Benefits/Options discussed with patient including but not limited to bleeding, infection, nerve damage, paralysis, failed block, incomplete pain control, headache, blood pressure changes, nausea, vomiting, reactions to medication both or allergic, itching and postpartum back pain. Confirmed with bedside nurse the patient's most recent platelet count. Confirmed with patient that they are not currently taking any anticoagulation, have any bleeding history or any family history of bleeding disorders. Patient expressed understanding and wished to proceed. All questions were answered. Sterile technique was used throughout the entire procedure. Please see nursing notes for vital signs. Test dose was given through epidural needle and negative prior to continuing to dose epidural or start infusion. Warning signs of high block given to the patient including shortness of breath, tingling/numbness in hands, complete motor block, or any concerning symptoms with instructions to call for help. Patient was given  instructions on fall risk and not to get out of bed. All questions and concerns addressed with instructions to call with any issues.

## 2017-04-14 ENCOUNTER — Encounter (HOSPITAL_COMMUNITY): Payer: Self-pay

## 2017-04-14 ENCOUNTER — Encounter: Payer: BC Managed Care – PPO | Admitting: Obstetrics and Gynecology

## 2017-04-14 LAB — RPR: RPR: NONREACTIVE

## 2017-04-14 MED ORDER — OXYCODONE-ACETAMINOPHEN 5-325 MG PO TABS: 1.0000 | ORAL_TABLET | ORAL | Status: DC | PRN

## 2017-04-14 NOTE — Progress Notes (Addendum)
Post Partum Day #1 Subjective: no complaints, up ad lib, voiding and tolerating PO  Objective: Blood pressure 126/61, pulse 76, temperature 98.2 F (36.8 C), temperature source Oral, resp. rate 18, height 5\' 4"  (1.626 m), weight 238 lb (108 kg), last menstrual period 07/17/2016, SpO2 100 %, unknown if currently breastfeeding.  Physical Exam:  General: alert, cooperative and no distress Lochia: appropriate Uterine Fundus: firm Incision: none DVT Evaluation: No evidence of DVT seen on physical exam. No cords or calf tenderness. No significant calf/ankle edema.   Recent Labs  04/13/17 1510  HGB 12.3  HCT 36.4    Assessment/Plan: Plan for discharge tomorrow, Breastfeeding and Contraception interval BTL with Depo injections.    LOS: 1 day   Morene Crocker, CNM 04/14/2017, 7:42 AM

## 2017-04-14 NOTE — Plan of Care (Signed)
Problem: Nutritional: Goal: Mothers verbalization of comfort with breastfeeding process will improve Outcome: Progressing Patient is has experience breastfeeding her other children as newborns. This baby is frequently sleepy at the breast and suckles for 5-10 minutes, intermittently. Discussed depth on the breast and alternate breast massage to keep baby actively feeding. Lactation Consultants and Nursing staff will continue to support mother with breastfeeding.

## 2017-04-14 NOTE — Anesthesia Postprocedure Evaluation (Signed)
Anesthesia Post Note  Patient: Melanie Castillo  Procedure(s) Performed: * No procedures listed *     Patient location during evaluation: Mother Baby Anesthesia Type: Epidural Level of consciousness: awake, awake and alert, oriented and patient cooperative Pain management: pain level controlled Vital Signs Assessment: post-procedure vital signs reviewed and stable Respiratory status: spontaneous breathing, nonlabored ventilation and respiratory function stable Cardiovascular status: stable Postop Assessment: no headache, no backache, patient able to bend at knees and no signs of nausea or vomiting Anesthetic complications: no    Last Vitals:  Vitals:   04/13/17 2101 04/14/17 0056  BP: 121/73 126/61  Pulse: 90 76  Resp:    Temp: 36.9 C 36.8 C    Last Pain:  Vitals:   04/14/17 0507  TempSrc:   PainSc: Asleep   Pain Goal:                 [name] L

## 2017-04-14 NOTE — Progress Notes (Signed)
NST 04/03/17 reactive

## 2017-04-14 NOTE — Lactation Note (Addendum)
This note was copied from a baby's chart. Lactation Consultation Note  Patient Name: Melanie Castillo [id number] Date: 04/14/2017 Reason for consult: Initial assessment   Initial consult with Exp BF mom of 54 hour old infant. Infant with 5 BF for 15-45 minutes, 3 voids and 3 stools since virth. Infant weight 7 lb 10.2 oz with 2% weight loss since birth. LATCH score 9.  Mom reports she is an experienced BF mom and feels like she has it. Room was full of visitors at this time. Infant was asleep. She has no questions/concerns. She denies nipple pain today or need for LC assistance at this time. She has a pump at home, she is unsure of what kind.   Mom is a [facility] client and is aware to call and make appt post d/c. Follow up tomorrow and prn. Enc mom to call out for feeding assistance prn.   Maternal Data Formula Feeding for Exclusion: No Has patient been taught Hand Expression?: Yes (per mom) Does the patient have breastfeeding experience prior to this delivery?: Yes  Feeding Feeding Type: Breast Fed Length of feed: 45 min  LATCH Score/Interventions Latch: Grasps breast easily, tongue down, lips flanged, rhythmical sucking.  Audible Swallowing: A few with stimulation  Type of Nipple: Everted at rest and after stimulation  Comfort (Breast/Nipple): Soft / non-tender     Hold (Positioning): No assistance needed to correctly position infant at breast.  LATCH Score: 9  Lactation Tools Discussed/Used WIC Program: Yes   Consult Status Consult Status: Follow-up Date: 04/15/17 Follow-up type: In-patient    Melanie Castillo 04/14/2017, 12:57 PM

## 2017-04-15 MED ORDER — IBUPROFEN 600 MG PO TABS: 600.0000 mg | ORAL_TABLET | Freq: Four times a day (QID) | ORAL | 2 refills | Status: DC

## 2017-04-15 MED ORDER — SENNOSIDES-DOCUSATE SODIUM 8.6-50 MG PO TABS: 2.0000 | ORAL_TABLET | ORAL | 2 refills | Status: DC

## 2017-04-15 MED ORDER — OXYCODONE-ACETAMINOPHEN 5-325 MG PO TABS: 1.0000 | ORAL_TABLET | ORAL | 0 refills | Status: DC | PRN

## 2017-04-15 NOTE — Discharge Summary (Signed)
OB Discharge Summary     Patient Name: Melanie Castillo DOB: [birth year] MRN: [id number]  Date of admission: 04/13/2017 Delivering MD: Daiva Nakayama   Date of discharge: 04/15/2017  Admitting diagnosis: 39 WKS, CTXS Intrauterine pregnancy: [redacted]w[redacted]d     Secondary diagnosis:  Active Problems:   Normal labor   Gestational diabetes mellitus (GDM) controlled on oral hypoglycemic drug  Additional problems: AMA     Discharge diagnosis: Term Pregnancy Delivered and GDM A2                                                                                                Post partum procedures:none  Augmentation: AROM  Complications: None  Hospital course:  Onset of Labor With Vaginal Delivery     36 y.o. yo [id number] at [redacted]w[redacted]d was admitted in Active Labor on 04/13/2017. Patient had an uncomplicated labor course as follows:  Membrane Rupture Time/Date: 5:54 PM ,04/13/2017   Intrapartum Procedures: Episiotomy: None [1]                                         Lacerations:  None [1]  Patient had a delivery of a Viable infant. 04/13/2017  Information for the patient's newborn:  Keelyn, Monjaras [[id number]]  Delivery Method: Vag-Spont    Pateint had an uncomplicated postpartum course.  She is ambulating, tolerating a regular diet, passing flatus, and urinating well. Patient is discharged home in stable condition on 04/15/17.   Physical exam  Vitals:   04/14/17 0056 04/14/17 1046 04/14/17 1757 04/15/17 0519  BP: 126/61 (!) 116/59 122/66 127/76  Pulse: 76 80 79 98  Resp:  18 19 18   Temp: 98.2 F (36.8 C) 98.2 F (36.8 C) 98.2 F (36.8 C) 98.2 F (36.8 C)  TempSrc: Oral Oral Oral Oral  SpO2:   97% 100%  Weight:      Height:       General: alert, cooperative and no distress Lochia: appropriate Uterine Fundus: firm Incision: N/A DVT Evaluation: No evidence of DVT seen on physical exam. No cords or calf tenderness. No significant calf/ankle edema. Labs: Lab Results  Component Value  Date   WBC 12.5 (H) 04/13/2017   HGB 12.3 04/13/2017   HCT 36.4 04/13/2017   MCV 73.5 (L) 04/13/2017   PLT 190 04/13/2017   CMP Latest Ref Rng & Units 12/14/2016  Glucose 65 - 99 mg/dL 107(H)  BUN 6 - 20 mg/dL 8  Creatinine 0.44 - 1.00 mg/dL 0.47  Sodium 135 - 145 mmol/L 135  Potassium 3.5 - 5.1 mmol/L 3.4(L)  Chloride 101 - 111 mmol/L 107  CO2 22 - 32 mmol/L 21(L)  Calcium 8.9 - 10.3 mg/dL 8.8(L)  Total Protein 6.5 - 8.1 g/dL 7.0  Total Bilirubin 0.3 - 1.2 mg/dL 0.2(L)  Alkaline Phos 38 - 126 U/L 46  AST 15 - 41 U/L 20  ALT 14 - 54 U/L 35    Discharge instruction: per After Visit Summary and "Baby and Me Booklet".  After  visit meds:  Allergies as of 04/15/2017   No Known Allergies     Medication List    STOP taking these medications   glyBURIDE 2.5 MG tablet Commonly known as:  DIABETA     TAKE these medications   ibuprofen 600 MG tablet Commonly known as:  ADVIL,MOTRIN Take 1 tablet (600 mg total) by mouth every 6 (six) hours.   oxyCODONE-acetaminophen 5-325 MG tablet Commonly known as:  PERCOCET/ROXICET Take 1 tablet by mouth every 4 (four) hours as needed for moderate pain (pain scale 4-7).   PRENATAL GUMMIES/DHA & FA 0.4-32.5 MG Chew Chew 2 each by mouth daily.   senna-docusate 8.6-50 MG tablet Commonly known as:  Senokot-S Take 2 tablets by mouth daily. Start taking on:  04/16/2017       Diet: routine diet  Activity: Advance as tolerated. Pelvic rest for 6 weeks.   Outpatient follow up:4 weeks Follow up Appt:Future Appointments Date Time Provider [place]  05/12/2017 10:00 AM Constant, Vickii Chafe, MD Rose None   Follow up Visit:No Follow-up on file.  Postpartum contraception: Tubal Ligation, interval planned  Newborn Data: Live born female  Birth Weight: 7 lb 13 oz (3544 g) APGAR: 8, 9  Baby Feeding: Breast Disposition:home with mother   04/15/2017 Morene Crocker, CNM

## 2017-04-15 NOTE — Lactation Note (Signed)
This note was copied from a baby's chart. Lactation Consultation Note  Patient Name: Melanie Castillo [id number] Date: 04/15/2017 Reason for consult: Follow-up assessment;Breast/nipple pain   Follow up with Exp BF mom of 69 hour old infant. Infant asleep in mom's arms. Infant with 3 BF for 15-30 minutes, formula feeds x 5 of 10-20 ml, 4 voids and 2 stools in last 24 hours.   Mom reports her infant has a strong suction. She is experiencing pain throughout the feeding with left nipple more painful that the right. She has coconut oil to use. Offered mom assistance with feeding before d/c and she declined saying she had worked with her nurse earlier. Explained to mom that nipple pain through feeding indicates a shallow latch or perhaps infant not using tongue correctly, she reports she did not want assistance.   Discussed supply and demand and engorgement prevention/treatment, mom reports she was aware. Mom has a pump at home, she is unsure of what kind.   Infant has follow up Ped appt Thursday. Mom to call and make [facility] appt post d/c.   Mom without further questions/concerns at this time. Enc mom to call with questions/concerns at this time.    Maternal Data Formula Feeding for Exclusion: No Has patient been taught Hand Expression?: Yes Does the patient have breastfeeding experience prior to this delivery?: Yes  Feeding Feeding Type: Bottle Fed - Formula Nipple Type: Slow - flow  LATCH Score/Interventions                      Lactation Tools Discussed/Used WIC Program: Yes Pump Review: Milk Storage   Consult Status Consult Status: Complete Follow-up type: Call as needed    Donn Pierini 04/15/2017, 10:25 AM

## 2017-04-15 NOTE — Progress Notes (Signed)
Post Partum Day #2 Subjective: no complaints, up ad lib, voiding and tolerating PO  Objective: Blood pressure 127/76, pulse 98, temperature 98.2 F (36.8 C), temperature source Oral, resp. rate 18, height 5\' 4"  (1.626 m), weight 238 lb (108 kg), last menstrual period 07/17/2016, SpO2 100 %, unknown if currently breastfeeding.  Physical Exam:  General: alert, cooperative and no distress Lochia: appropriate Uterine Fundus: firm Incision: none DVT Evaluation: No evidence of DVT seen on physical exam. No cords or calf tenderness. No significant calf/ankle edema.   Recent Labs  04/13/17 1510  HGB 12.3  HCT 36.4    Assessment/Plan: Discharge home and Contraception interval BTL   LOS: 2 days   Morene Crocker, CNM 04/15/2017, 8:18 AM

## 2017-04-15 NOTE — Discharge Instructions (Signed)

## 2017-04-17 ENCOUNTER — Inpatient Hospital Stay (HOSPITAL_COMMUNITY): Admission: RE | Admit: 2017-04-17 | Payer: BC Managed Care – PPO | Source: Ambulatory Visit

## 2017-05-12 ENCOUNTER — Ambulatory Visit: Payer: BC Managed Care – PPO | Admitting: Obstetrics and Gynecology

## 2017-05-14 ENCOUNTER — Ambulatory Visit (INDEPENDENT_AMBULATORY_CARE_PROVIDER_SITE_OTHER): Payer: BC Managed Care – PPO | Admitting: Obstetrics

## 2017-05-14 ENCOUNTER — Encounter: Payer: Self-pay | Admitting: Obstetrics

## 2017-05-14 ENCOUNTER — Other Ambulatory Visit (HOSPITAL_COMMUNITY)
Admission: RE | Admit: 2017-05-14 | Discharge: 2017-05-14 | Disposition: A | Discharge status: Home / Self Care | Payer: BC Managed Care – PPO | Source: Ambulatory Visit | Attending: Obstetrics | Admitting: Obstetrics

## 2017-05-14 VITALS — BP 143/84 | HR 83 | Wt 233.4 lb

## 2017-05-14 DIAGNOSIS — N898 Other specified noninflammatory disorders of vagina: Secondary | ICD-10-CM

## 2017-05-14 DIAGNOSIS — N3000 Acute cystitis without hematuria: Secondary | ICD-10-CM

## 2017-05-14 DIAGNOSIS — O99345 Other mental disorders complicating the puerperium: Secondary | ICD-10-CM

## 2017-05-14 DIAGNOSIS — R300 Dysuria: Secondary | ICD-10-CM

## 2017-05-14 DIAGNOSIS — Z1389 Encounter for screening for other disorder: Secondary | ICD-10-CM | POA: Diagnosis not present

## 2017-05-14 DIAGNOSIS — Z113 Encounter for screening for infections with a predominantly sexual mode of transmission: Secondary | ICD-10-CM | POA: Diagnosis not present

## 2017-05-14 DIAGNOSIS — F53 Postpartum depression: Secondary | ICD-10-CM

## 2017-05-14 LAB — POCT URINALYSIS DIPSTICK
BILIRUBIN UA: NEGATIVE
GLUCOSE UA: NEGATIVE
KETONES UA: NEGATIVE
NITRITE UA: NEGATIVE
PH UA: 6 (ref 5.0–8.0)
Protein, UA: NEGATIVE
RBC UA: NEGATIVE
Spec Grav, UA: 1.02 (ref 1.010–1.025)
Urobilinogen, UA: 0.2 E.U./dL

## 2017-05-14 MED ORDER — SERTRALINE HCL 50 MG PO TABS: 50.0000 mg | ORAL_TABLET | Freq: Every day | ORAL | 5 refills | Status: DC

## 2017-05-14 MED ORDER — FLUCONAZOLE 150 MG PO TABS: 150.0000 mg | ORAL_TABLET | Freq: Once | ORAL | 0 refills | Status: AC

## 2017-05-14 MED ORDER — NITROFURANTOIN MONOHYD MACRO 100 MG PO CAPS: 100.0000 mg | ORAL_CAPSULE | Freq: Two times a day (BID) | ORAL | 2 refills | Status: DC

## 2017-05-14 MED ORDER — CLINDAMYCIN PHOSPHATE (1 DOSE) 2 % VA CREA: TOPICAL_CREAM | VAGINAL | 0 refills | Status: DC

## 2017-05-14 NOTE — Progress Notes (Signed)
Post Partum Exam  Melanie Castillo is a 36 y.o. G25P4004 female who presents for a postpartum visit. She is 4 weeks postpartum following a spontaneous vaginal delivery. I have fully reviewed the prenatal and intrapartum course. The delivery was at 59 gestational weeks.  Anesthesia: epidural. Postpartum course has been positive for depression. Baby's course has been abnormal- skin abnormalty. Baby is feeding by both breast and bottle - Gerber- gentle Soothe. Bleeding no bleeding. Bowel function is normal. Bladder function is normal. Patient is not sexually active. Contraception method is abstinence. Postpartum depression screening:positive  The following portions of the patient's history were reviewed and updated as appropriate: allergies, current medications, past family history, past medical history, past social history, past surgical history and problem list.  Review of Systems A comprehensive review of systems was negative except for: Genitourinary: positive for vaginal discharge and dysuria Behavioral/Psych: positive for depression    Objective:  unknown if currently breastfeeding.  General:  alert and mild distress   Breasts:  inspection negative, no nipple discharge or bleeding, no masses or nodularity palpable  Lungs: clear to auscultation bilaterally  Heart:  regular rate and rhythm, S1, S2 normal, no murmur, click, rub or gallop  Abdomen: soft, non-tender; bowel sounds normal; no masses,  no organomegaly   Vulva:  normal  Vagina: normal vagina  Cervix:  no cervical motion tenderness  Corpus: normal size, contour, position, consistency, mobility, non-tender  Adnexa:  no mass, fullness, tenderness  Rectal Exam: Not performed.        Assessment and Plan:    1. Vaginal discharge Rx: - Clindamycin Phosphate, 1 Dose, vaginal cream; 1 applicator intravaginally at bedtime.  Dispense: 5.8 g; Refill: 0 - fluconazole (DIFLUCAN) 150 MG tablet; Take 1 tablet (150 mg total) by mouth once.   Dispense: 1 tablet; Refill: 0 - Cervicovaginal ancillary only  2. Postpartum depression Rx: - Ambulatory referral to [facility] - sertraline (ZOLOFT) 50 MG tablet; Take 1 tablet (50 mg total) by mouth daily.  Dispense: 30 tablet; Refill: 5  3. Burning with urination Rx: - POCT Urinalysis Dipstick - Urine Culture  4. Acute cystitis without hematuria Rx: - nitrofurantoin, macrocrystal-monohydrate, (MACROBID) 100 MG capsule; Take 1 capsule (100 mg total) by mouth 2 (two) times daily.  Dispense: 14 capsule; Refill: 2  Plan:   1. Contraception: abstinence 2. Referred to [facility] for counseling 3. Follow up in: 4 weeks or as needed.

## 2017-05-15 ENCOUNTER — Encounter: Payer: Self-pay | Admitting: Obstetrics

## 2017-05-16 LAB — URINE CULTURE

## 2017-05-16 LAB — CERVICOVAGINAL ANCILLARY ONLY
Bacterial vaginitis: POSITIVE — AB
CANDIDA VAGINITIS: POSITIVE — AB
TRICH (WINDOWPATH): NEGATIVE

## 2017-05-17 ENCOUNTER — Other Ambulatory Visit: Payer: Self-pay | Admitting: Obstetrics

## 2017-05-17 DIAGNOSIS — B3731 Acute candidiasis of vulva and vagina: Secondary | ICD-10-CM

## 2017-05-17 DIAGNOSIS — B373 Candidiasis of vulva and vagina: Secondary | ICD-10-CM

## 2017-05-17 MED ORDER — FLUCONAZOLE 150 MG PO TABS: 150.0000 mg | ORAL_TABLET | Freq: Once | ORAL | 0 refills | Status: AC

## 2017-05-26 ENCOUNTER — Ambulatory Visit: Payer: BC Managed Care – PPO | Admitting: Obstetrics and Gynecology

## 2017-06-11 ENCOUNTER — Ambulatory Visit: Payer: BC Managed Care – PPO | Admitting: Certified Nurse Midwife

## 2017-06-11 ENCOUNTER — Ambulatory Visit: Payer: BC Managed Care – PPO | Admitting: Obstetrics

## 2017-07-08 IMAGING — US US MFM OB FOLLOW-UP
1 series · 14 of 28 positions shown · non-contrast
Comparison: none

[Series 1: us mfm ob follow-up · 52 acquisitions, 14 frames shown]
[im 2/52]
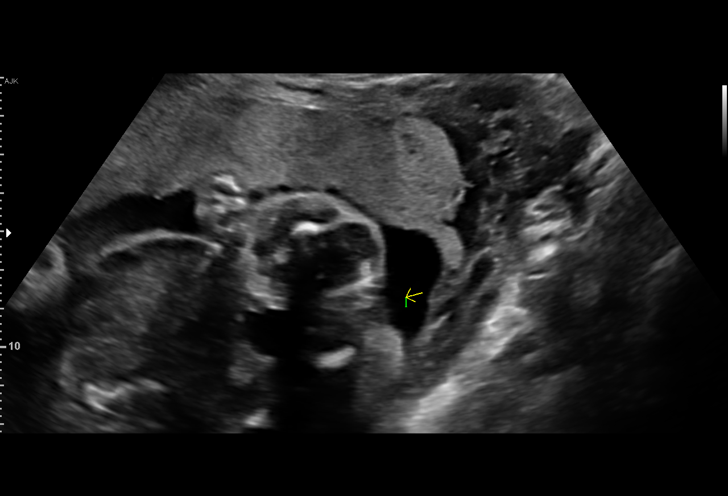
[im 6/52]
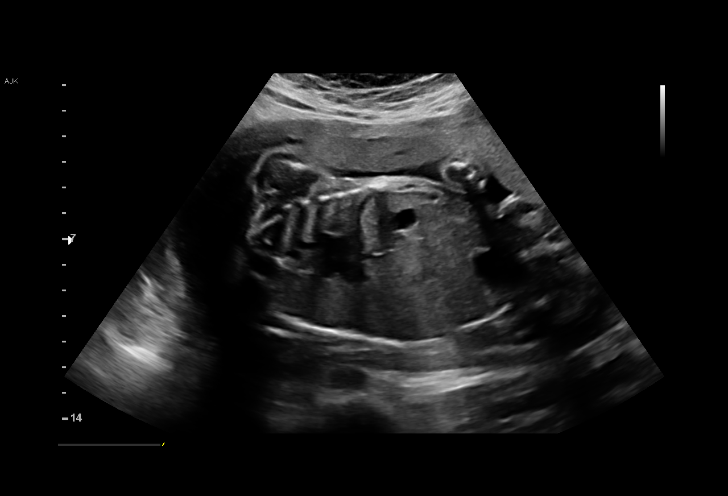
[im 10/52]
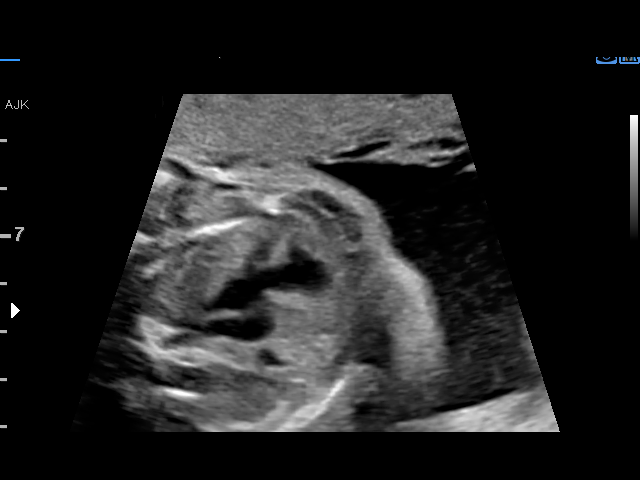
[im 14/52]
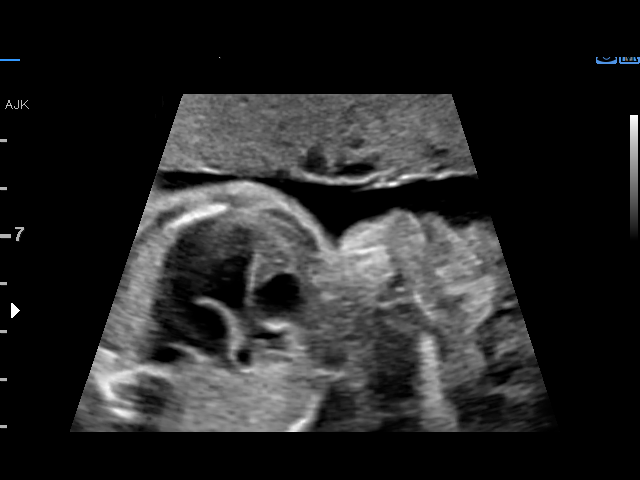
[im 18/52]
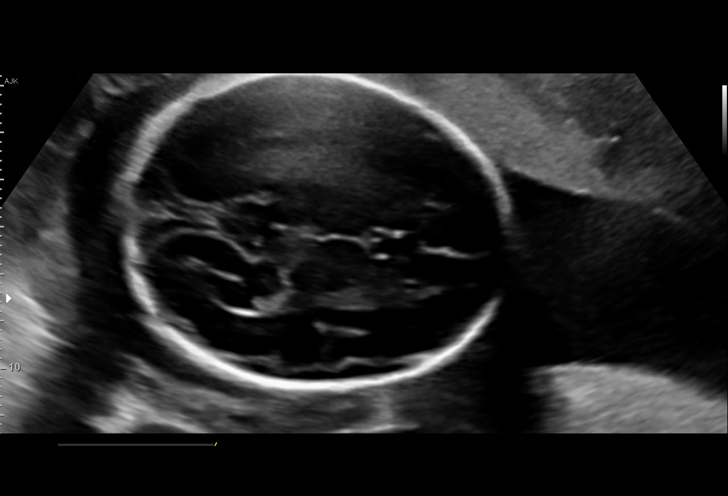
[im 21/52]
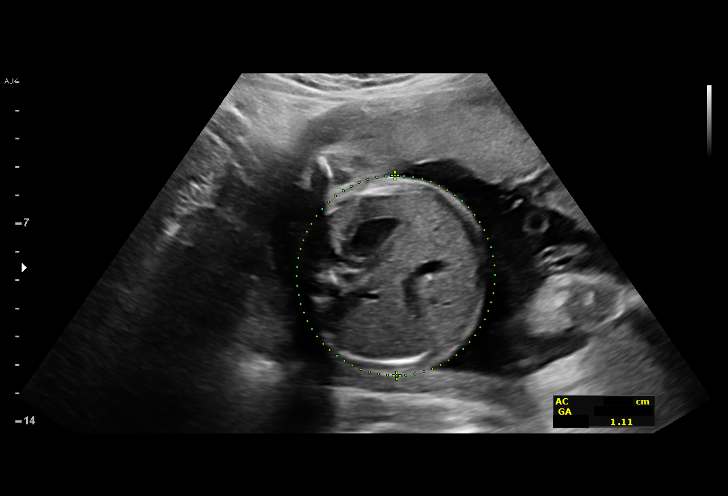
[im 25/52]
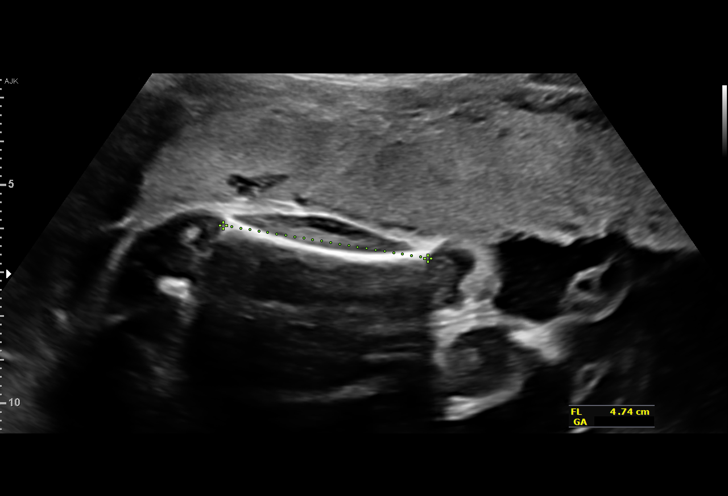
[im 29/52]
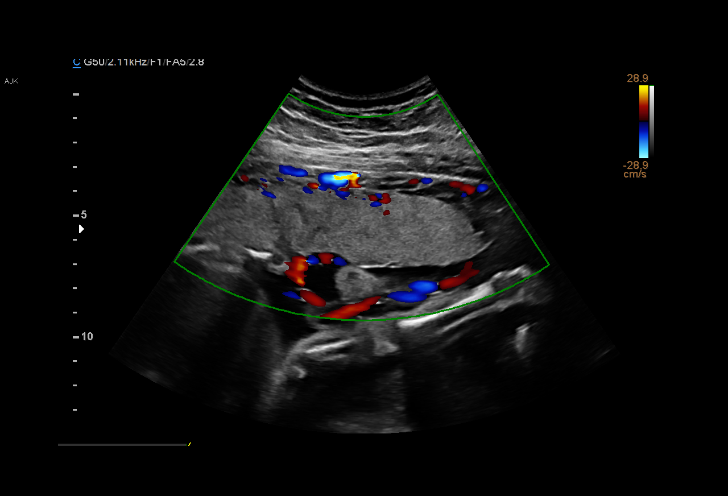
[im 33/52]
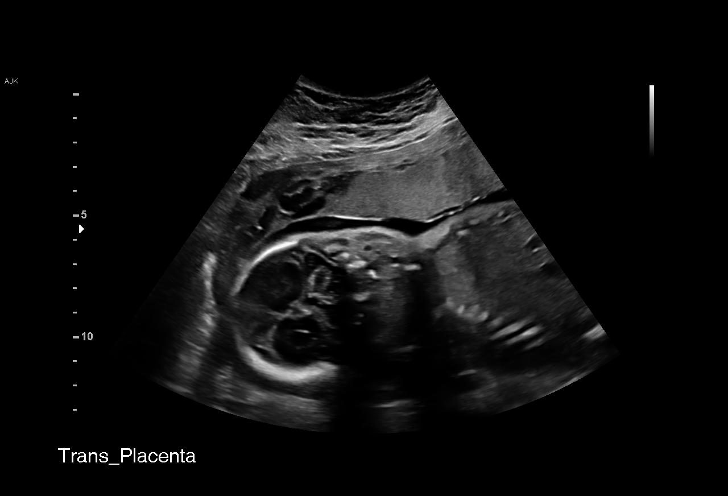
[im 36/52]
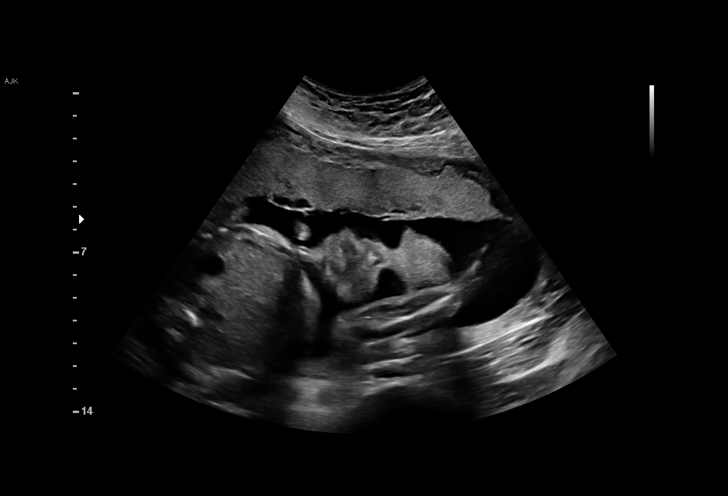
[im 40/52]
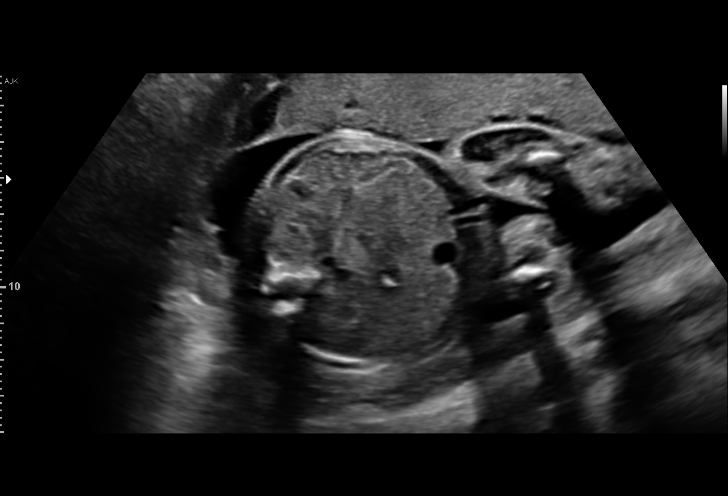
[im 44/52]
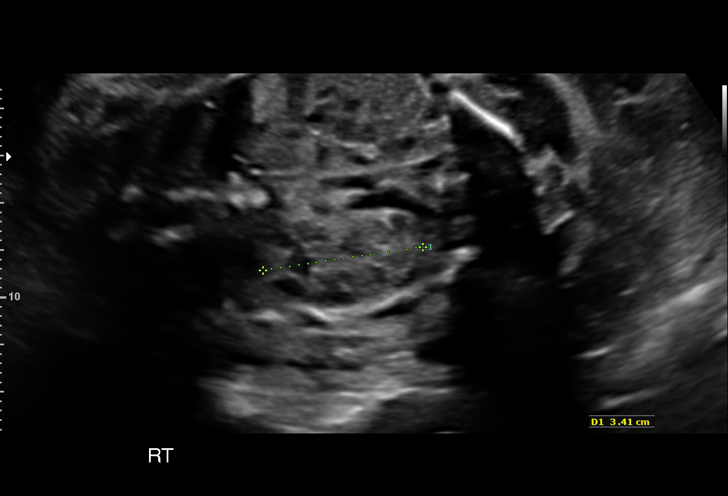
[im 48/52]
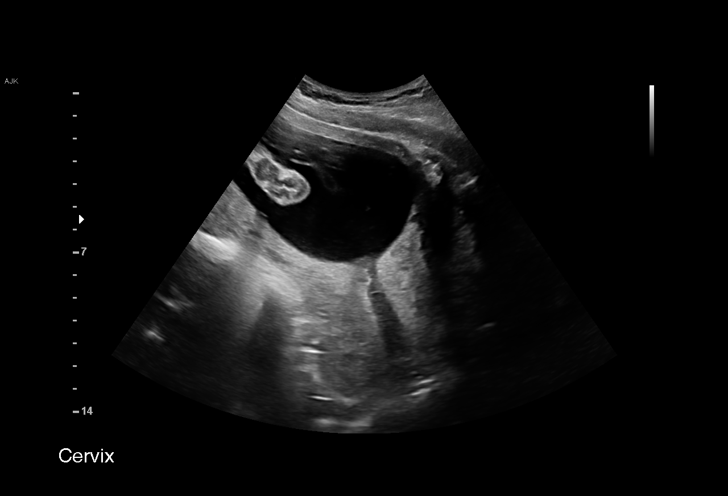
[im 52/52]
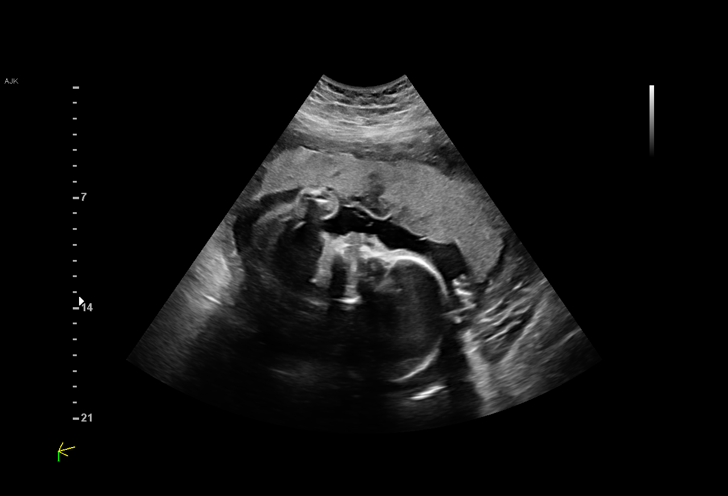

[14 of 28 positions shown; findings below may reference images not displayed]

OB/Gyn Clinic
[REDACTED]

1  PAOLINO LISCI            [id number]      [id number]     [id number]
Indications

25 weeks gestation of pregnancy
Encounter for other antenatal screening
follow-up
Advanced maternal age multigravida 35+,
second trimester; low risk quad screen
Obesity complicating pregnancy, second
trimester
OB History

Blood Type:            Height:  5'4"   Weight (lb):  246      BMI:
Gravidity:    4         Term:   3
Living:       3
Fetal Evaluation

Num Of Fetuses:     1
Fetal Heart         146
Rate(bpm):
Cardiac Activity:   Observed
Presentation:       Transverse, head to maternal right
Placenta:           Anterior, above cervical os
P. Cord Insertion:  Visualized, central
Amniotic Fluid
AFI FV:      Subjectively within normal limits

Largest Pocket(cm)
3.7
Biometry

BPD:      66.7  mm     G. Age:  26w 6d         89  %    CI:        73.81   %   70 - 86
FL/HC:      19.1   %   18.7 -
HC:      246.6  mm     G. Age:  26w 5d         79  %    HC/AC:      1.11       1.04 -
AC:      221.2  mm     G. Age:  26w 4d         78  %    FL/BPD:     70.5   %   71 - 87
FL:         47  mm     G. Age:  25w 5d         49  %    FL/AC:      21.2   %   20 - 24
Est. FW:     916  gm          2 lb      73  %
Gestational Age

LMP:           25w 2d       Date:   07/20/16                 EDD:   04/26/17
U/S Today:     26w 3d                                        EDD:   04/18/17
Best:          25w 2d    Det. By:   LMP  (07/20/16)          EDD:   04/26/17
Anatomy

Cranium:               Appears normal         Aortic Arch:            Previously seen
Cavum:                 Appears normal         Ductal Arch:            Previously seen
Ventricles:            Appears normal         Diaphragm:              Appears normal
Choroid Plexus:        Previously seen        Stomach:                Appears normal, left
sided
Cerebellum:            Previously seen        Abdomen:                Appears normal
Posterior Fossa:       Previously seen        Abdominal Wall:         Previously seen
Nuchal Fold:           Previously seen        Cord Vessels:           Previously seen
Face:                  Orbits and profile     Kidneys:                Appear normal
previously seen
Lips:                  Appears normal         Bladder:                Appears normal
Thoracic:              Appears normal         Spine:                  Not well visualized
Heart:                 Appears normal         Upper Extremities:      Previously seen
(4CH, axis, and
situs)
RVOT:                  3VV normal             Lower Extremities:      Previously seen
LVOT:                  Appears normal

Other:  Female gender.Heels and 5th digit previously seen.. Technically
difficult due to fetal position.
Cervix Uterus Adnexa

Cervix
Length:           4.58  cm.
Normal appearance by transabdominal scan.

Uterus
No abnormality visualized.

Left Ovary
Not visualized.

Right Ovary
Not visualized.

Adnexa:       No abnormality visualized.
Impression

SIUP at 25+2 weeks
Normal interval anatomy; anatomic survey complete except
for RVOT and spine
Normal amniotic fluid volume
Appropriate interval growth with EFW at the 73rd %tile
Recommendations

Follow-up as clinically indicated

## 2017-12-20 ENCOUNTER — Emergency Department (HOSPITAL_COMMUNITY): Payer: BC Managed Care – PPO

## 2017-12-20 ENCOUNTER — Encounter (HOSPITAL_COMMUNITY): Payer: Self-pay | Admitting: Emergency Medicine

## 2017-12-20 ENCOUNTER — Emergency Department (HOSPITAL_COMMUNITY)
Admission: EM | Admit: 2017-12-20 | Discharge: 2017-12-20 | Disposition: A | Discharge status: Home / Self Care | Payer: BC Managed Care – PPO | Attending: Emergency Medicine | Admitting: Emergency Medicine

## 2017-12-20 ENCOUNTER — Other Ambulatory Visit: Payer: Self-pay

## 2017-12-20 DIAGNOSIS — Z79899 Other long term (current) drug therapy: Secondary | ICD-10-CM | POA: Diagnosis not present

## 2017-12-20 DIAGNOSIS — M25561 Pain in right knee: Secondary | ICD-10-CM | POA: Diagnosis present

## 2017-12-20 MED ORDER — LIDOCAINE 5 % EX PTCH: 1.0000 | MEDICATED_PATCH | CUTANEOUS | 0 refills | Status: DC

## 2017-12-20 MED ORDER — NAPROXEN 375 MG PO TABS: 375.0000 mg | ORAL_TABLET | Freq: Two times a day (BID) | ORAL | 0 refills | Status: DC

## 2017-12-20 MED ORDER — LIDOCAINE 5 % EX PTCH
1.0000 | MEDICATED_PATCH | Freq: Once | CUTANEOUS | Status: DC
  Administered 2017-12-20: 1 via TRANSDERMAL
  Filled 2017-12-20: qty 1

## 2017-12-20 MED ORDER — ACETAMINOPHEN 500 MG PO TABS
1000.0000 mg | ORAL_TABLET | Freq: Once | ORAL | Status: AC
  Administered 2017-12-20: 1000 mg via ORAL
  Filled 2017-12-20: qty 2

## 2017-12-20 MED ORDER — TRAMADOL HCL 50 MG PO TABS: 50.0000 mg | ORAL_TABLET | Freq: Four times a day (QID) | ORAL | 0 refills | Status: DC | PRN

## 2017-12-20 NOTE — ED Notes (Signed)
Ortho tech/Boaz Berisha on way with crutches and knee immobilizer

## 2017-12-20 NOTE — ED Provider Notes (Signed)
[facility] EMERGENCY DEPARTMENT Provider Note   CSN: [id number] Arrival date & time: 12/20/17  1343     History   Chief Complaint Chief Complaint  Patient presents with  . Knee Pain    HPI Melanie Castillo is a 37 y.o. female.  HPI 37 year old African-American female with no pertinent past medical history presents to the emergency department today with complaints of right knee pain.  Patient states that while at dance practice with her daughter today she fell down and landed on her right knee.  Patient states the fall was mechanical.  She denies head injury or LOC.  States that she heard a "crackling" sound.  Patient states that she has not been able to bear weight on the right leg due to the pain since.  Reports the pain is in her right knee and is worse with ambulation, palpation and range of motion.  She is not take anything for the pain prior to arrival.  Patient denies any pain to her right ankle and right hip.  Denies any injury to the right knee prior.  Denies any associated paresthesias or weakness. Past Medical History:  Diagnosis Date  . Gestational diabetes    glyburide  . Medical history non-contributory     Patient Active Problem List   Diagnosis Date Noted  . Normal labor 04/13/2017  . Gestational diabetes mellitus (GDM) controlled on oral hypoglycemic drug 04/13/2017  . GBS (group B Streptococcus carrier), +RV culture, currently pregnant 04/02/2017  . Gestational diabetes mellitus (GDM) in third trimester 02/18/2017  . Supervision of normal pregnancy, antepartum 11/27/2016  . AMA (advanced maternal age) multigravida 35+ 11/27/2016    Past Surgical History:  Procedure Laterality Date  . NO PAST SURGERIES      OB History    Gravida Para Term Preterm AB Living   4 4 4     4    SAB TAB Ectopic Multiple Live Births         0 4       Home Medications    Prior to Admission medications   Medication Sig Start Date End Date Taking?  Authorizing Provider  ibuprofen (ADVIL,MOTRIN) 200 MG tablet Take 800 mg by mouth every 6 (six) hours as needed for headache.   Yes [provider]  naproxen sodium (ALEVE) 220 MG tablet Take 440 mg by mouth daily as needed (back pain).   Yes [provider]  pseudoephedrine (SUDAFED) 120 MG 12 hr tablet Take 120 mg by mouth every 12 (twelve) hours as needed for congestion.   Yes [provider]  sertraline (ZOLOFT) 50 MG tablet Take 1 tablet (50 mg total) by mouth daily. Patient not taking: Reported on 12/20/2017 05/14/17   Shelly Bombard, MD    Family History Family History  Problem Relation Age of Onset  . Diabetes Mother   . Hypertension Mother   . Thyroid disease Mother   . Colon cancer Father   . Diabetes Unknown   . Hypertension Unknown     Social History Social History   Tobacco Use  . Smoking status: Never Smoker  . Smokeless tobacco: Never Used  Substance Use Topics  . Alcohol use: No    Comment: occasional  . Drug use: No     Allergies   Patient has no known allergies.   Review of Systems Review of Systems  All other systems reviewed and are negative.    Physical Exam Updated Vital Signs BP 113/76 (BP Location:  Right Arm)   Pulse 77   Temp 98 F (36.7 C) (Oral)   Resp 16   LMP 12/20/2017   SpO2 100%   Physical Exam  Constitutional: She appears well-developed and well-nourished. No distress.  HENT:  Head: Normocephalic and atraumatic.  Eyes: Right eye exhibits no discharge. Left eye exhibits no discharge. No scleral icterus.  Neck: Normal range of motion.  Pulmonary/Chest: No respiratory distress.  Musculoskeletal:       Right knee: She exhibits decreased range of motion, swelling, abnormal patellar mobility, bony tenderness and abnormal meniscus. She exhibits no effusion, no ecchymosis, no deformity, no laceration, no erythema, normal alignment and no LCL laxity. Tenderness found. Medial joint line, lateral joint line  and patellar tendon tenderness noted.  Crepitus noted with rom of the right knee.  Positive McMurray sign.  DP pulses are 2+ bilaterally.  Sensation intact.  Brisk cap refill.  No joint laxity with valgus and varus stress.  Negative anterior drawer test.  Full range of motion of the right hip and right ankle without pain.  No lower extremity edema or calf tenderness.  Skin compartments are soft.  Neurological: She is alert.  Skin: Skin is warm and dry. Capillary refill takes less than 2 seconds. No pallor.  Psychiatric: Her behavior is normal. Judgment and thought content normal.  Nursing note and vitals reviewed.    ED Treatments / Results  Labs (all labs ordered are listed, but only abnormal results are displayed) Labs Reviewed - No data to display  EKG  EKG Interpretation None       Radiology Dg Knee Complete 4 Views Right  Result Date: 12/20/2017 CLINICAL DATA:  Knee injury.  Fall, knee pain. EXAM: RIGHT KNEE - COMPLETE 4+ VIEW COMPARISON:  None. FINDINGS: No evidence of fracture, dislocation, or joint effusion. No evidence of arthropathy or other focal bone abnormality. Soft tissues are unremarkable. IMPRESSION: Negative. Electronically Signed   By: Rolm Baptise M.D.   On: 12/20/2017 14:22    Procedures Procedures (including critical care time)  Medications Ordered in ED Medications  acetaminophen (TYLENOL) tablet 1,000 mg (not administered)  lidocaine (LIDODERM) 5 % 1 patch (not administered)     Initial Impression / Assessment and Plan / ED Course  I have reviewed the triage vital signs and the nursing notes.  Pertinent labs & imaging results that were available during my care of the patient were reviewed by me and considered in my medical decision making (see chart for details).     Patient X-Ray negative for obvious fracture or dislocation.  Patient is neurovascularly intact.  Pain managed in ED. Pt advised to follow up with orthopedics if symptoms persist for  possibility of missed fracture diagnosis. Patient given brace while in ED along with crutches, conservative therapy recommended and discussed. Patient will be dc home & is agreeable with above plan.  Pt is hemodynamically stable, in NAD, & able to ambulate in the ED. Evaluation does not show pathology that would require ongoing emergent intervention or inpatient treatment. I explained the diagnosis to the patient. Pain has been managed & has no complaints prior to dc. Pt is comfortable with above plan and is stable for discharge at this time. All questions were answered prior to disposition. Strict return precautions for f/u to the ED were discussed. Encouraged follow up with PCP.    Final Clinical Impressions(s) / ED Diagnoses   Final diagnoses:  Acute pain of right knee    ED Discharge Orders  Ordered    naproxen (NAPROSYN) 375 MG tablet  2 times daily,   Status:  Discontinued     12/20/17 1558    lidocaine (LIDODERM) 5 %  Every 24 hours,   Status:  Discontinued     12/20/17 1558    lidocaine (LIDODERM) 5 %  Every 24 hours     12/20/17 1559    traMADol (ULTRAM) 50 MG tablet  Every 6 hours PRN     12/20/17 1559       Doristine Devoid, PA-C 12/20/17 1603    Davonna Belling, MD 12/21/17 [id number]

## 2017-12-20 NOTE — ED Notes (Signed)
Patient is alert and orientedx4.  Patient was explained discharge instructions and they understood them with no questions.  The patient's husband, Marty Heck is taking her home.

## 2017-12-20 NOTE — Discharge Instructions (Signed)
Your x-ray did not show any signs of fracture.  Wear the knee immobilizer for comfort.  Use the crutches for weightbearing as tolerated. Please rest, ice, compress and elevated the affected body part to help with swelling and pain.  Have given a short course of pain medicine to take for pain that is not controlled with NSAIDs such as Aleve, ibuprofen, Motrin.  I would also take Tylenol.  Have given you lidocaine patches to apply over your knee to help with the pain.  Return to the ED with any worsening symptoms and follow with orthopedic doctor.

## 2017-12-20 NOTE — ED Triage Notes (Signed)
Patient presnets to ED for assessment of right knee pain after she was dancing with her daughter and fall down on to it.  Patient states she heard a "crackling" sound.  Patient c/o severe pain when weight bearing.  Denies pain with sitting.

## 2017-12-20 NOTE — Progress Notes (Signed)
Orthopedic Tech Progress Note Patient Details:  Melanie Castillo 02-05-1981 [id number]  Ortho Devices Type of Ortho Device: Crutches, Knee Immobilizer Ortho Device/Splint Interventions: Application   Post Interventions Patient Tolerated: Well Instructions Provided: Care of device, Adjustment of device   [place] Pink 12/20/2017, 4:14 PM

## 2017-12-23 DIAGNOSIS — M25561 Pain in right knee: Secondary | ICD-10-CM | POA: Insufficient documentation

## 2018-06-14 IMAGING — CR DG KNEE COMPLETE 4+V*R*
4 series · 4 of 4 positions shown · non-contrast
Comparison: None.

CLINICAL DATA: Knee injury.  Fall, knee pain.

EXAM:
RIGHT KNEE - COMPLETE 4+ VIEW

[knee ap]
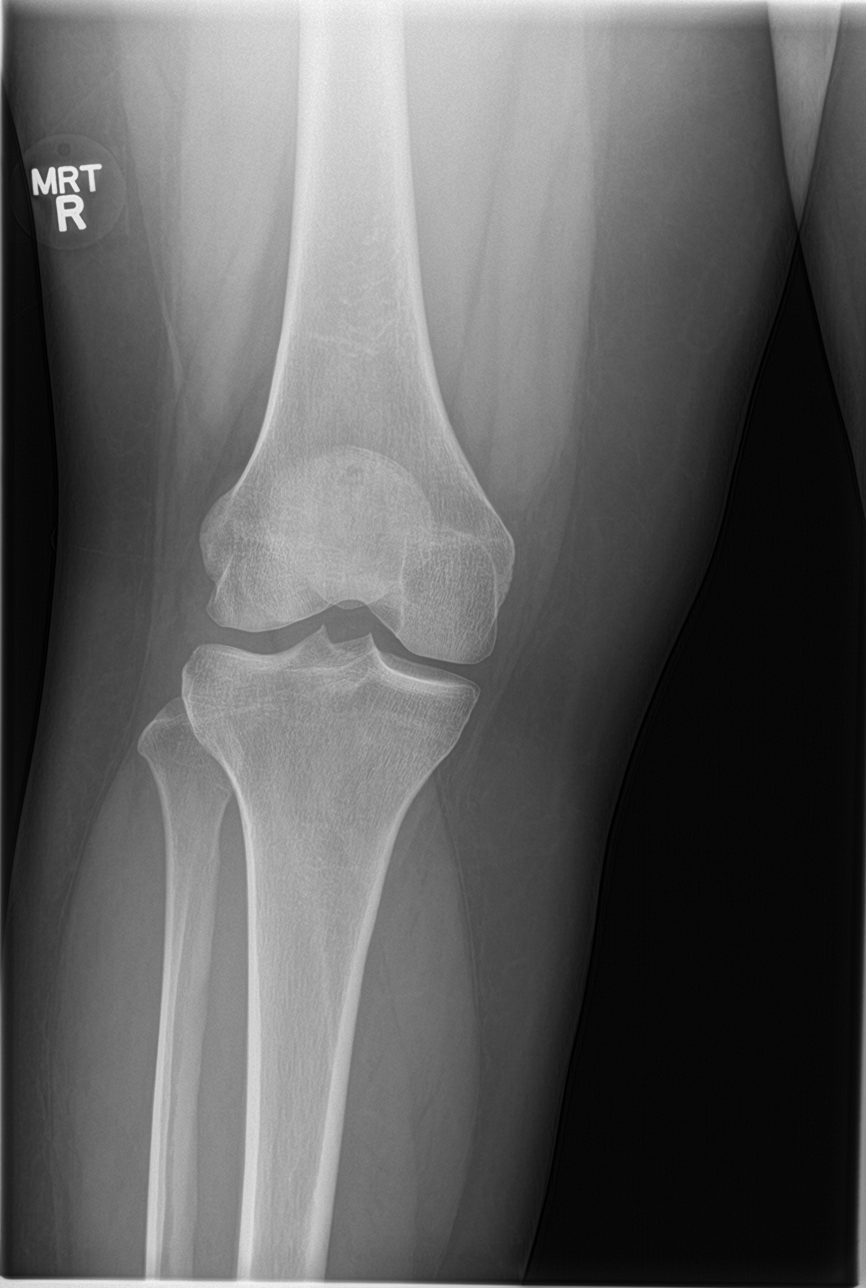

[knee lat]
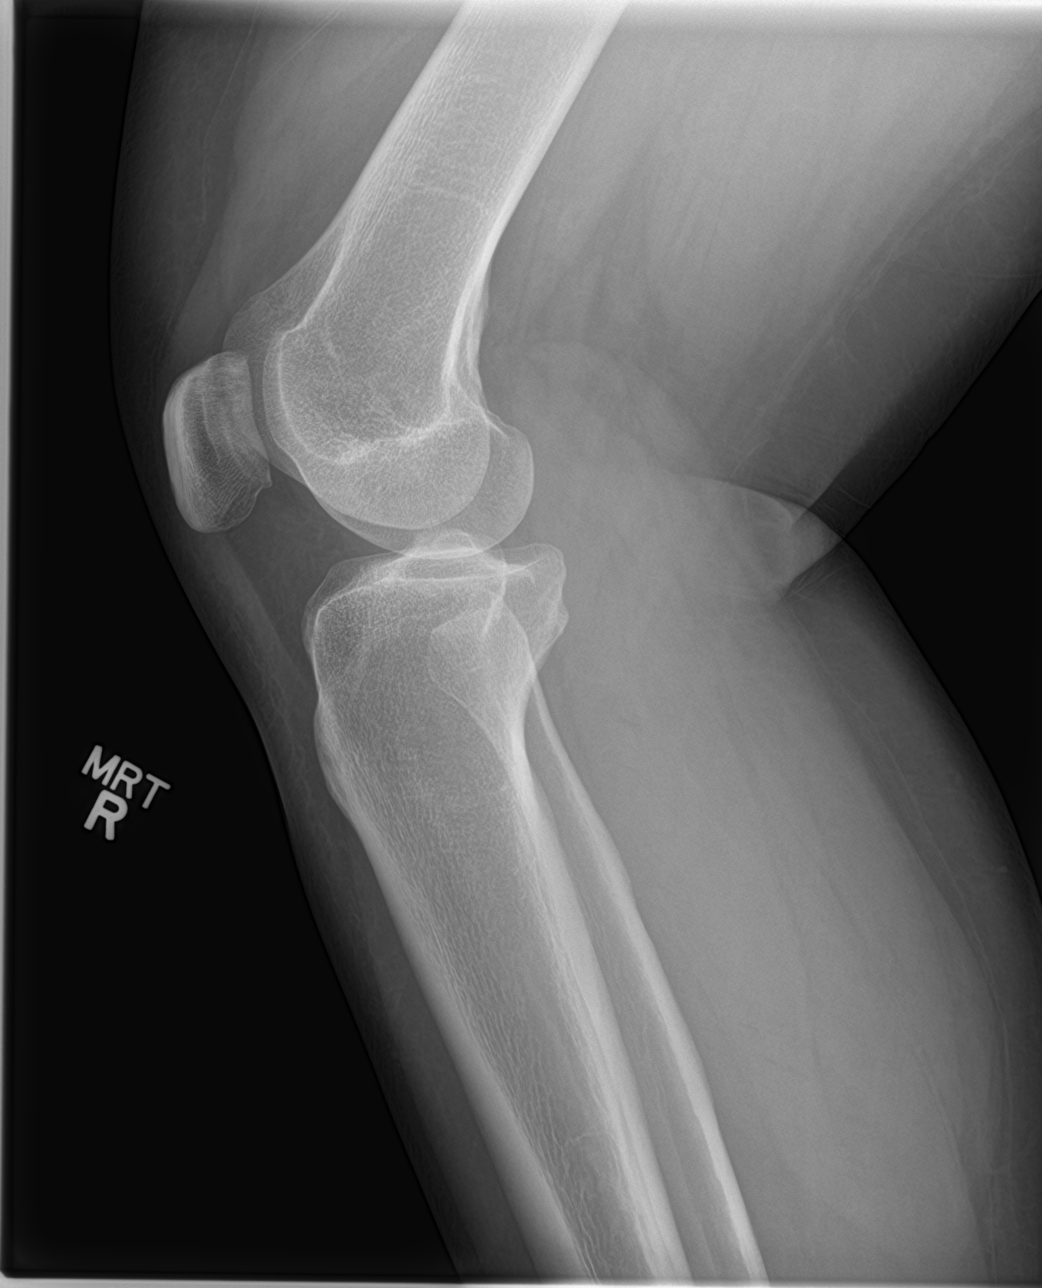

[knee obl (1 of 2)]
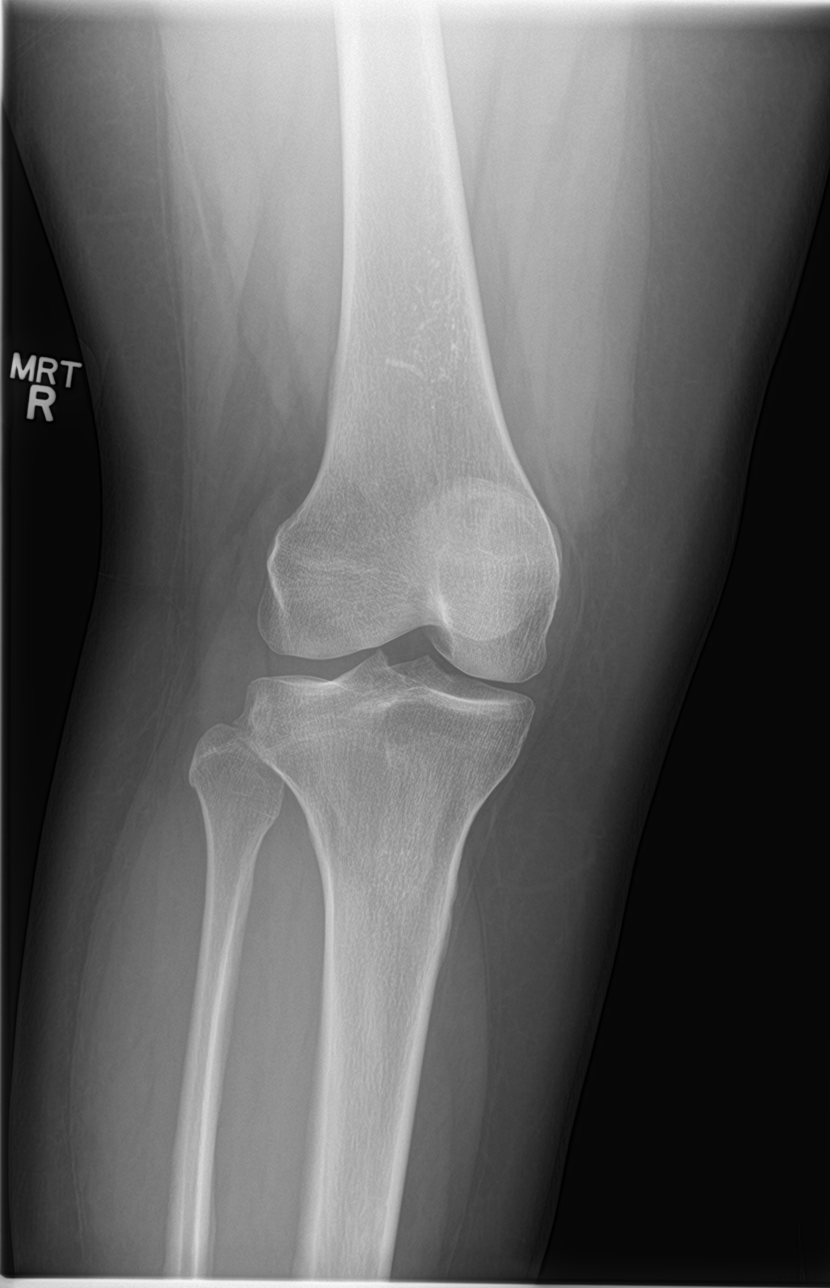

[knee obl (2 of 2)]
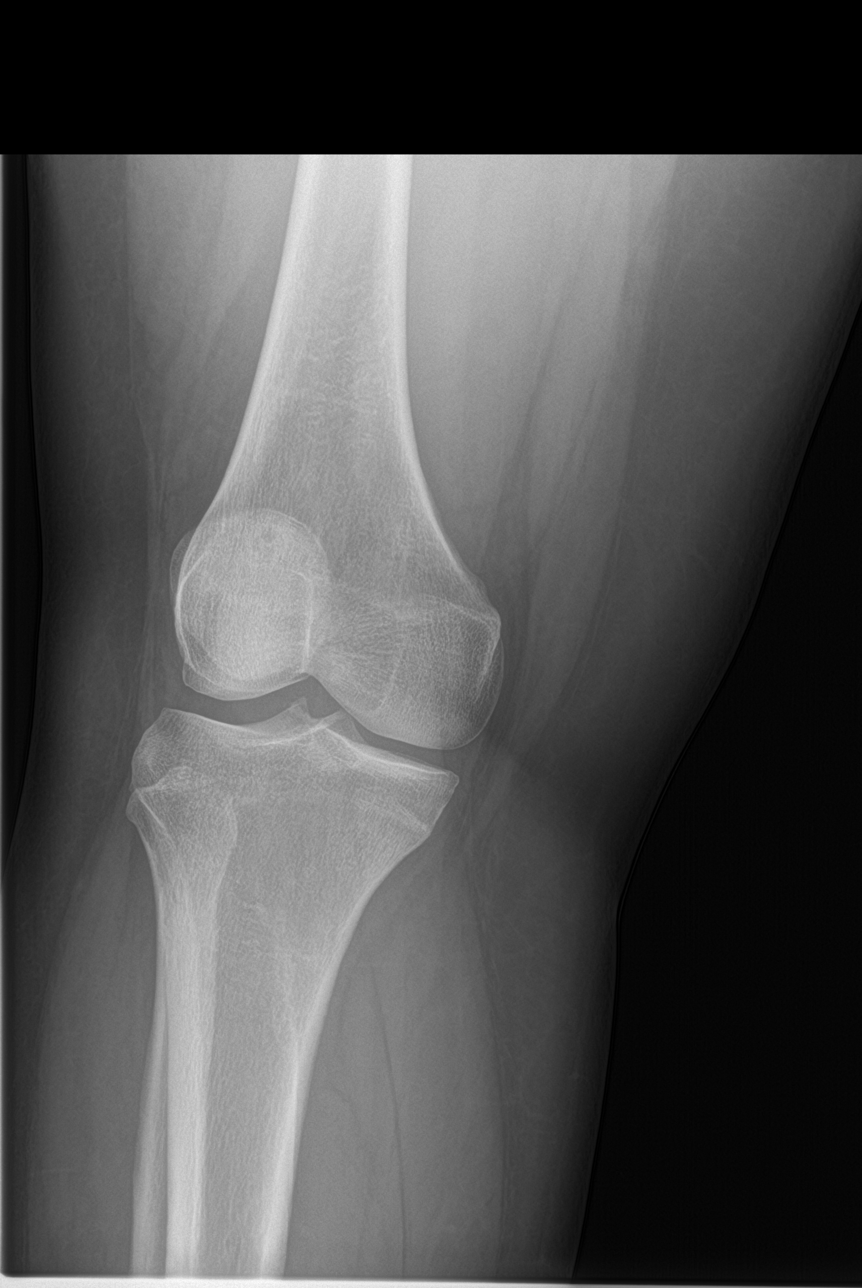

[4 of 4 positions shown; findings below may reference images not displayed]

FINDINGS: No evidence of fracture, dislocation, or joint effusion. No evidence
of arthropathy or other focal bone abnormality. Soft tissues are
unremarkable.
IMPRESSION: Negative.

## 2019-03-14 ENCOUNTER — Encounter (HOSPITAL_COMMUNITY): Payer: Self-pay

## 2019-03-14 ENCOUNTER — Emergency Department (HOSPITAL_COMMUNITY)
Admission: EM | Admit: 2019-03-14 | Discharge: 2019-03-14 | Disposition: A | Discharge status: Home / Self Care | Payer: BC Managed Care – PPO | Attending: Emergency Medicine | Admitting: Emergency Medicine

## 2019-03-14 ENCOUNTER — Other Ambulatory Visit: Payer: Self-pay

## 2019-03-14 DIAGNOSIS — I10 Essential (primary) hypertension: Secondary | ICD-10-CM | POA: Insufficient documentation

## 2019-03-14 DIAGNOSIS — Z79899 Other long term (current) drug therapy: Secondary | ICD-10-CM | POA: Insufficient documentation

## 2019-03-14 DIAGNOSIS — Z859 Personal history of malignant neoplasm, unspecified: Secondary | ICD-10-CM | POA: Diagnosis not present

## 2019-03-14 DIAGNOSIS — F419 Anxiety disorder, unspecified: Secondary | ICD-10-CM | POA: Diagnosis not present

## 2019-03-14 HISTORY — DX: Dizziness and giddiness: R42

## 2019-03-14 HISTORY — DX: Malignant (primary) neoplasm, unspecified: C80.1

## 2019-03-14 HISTORY — DX: Essential (primary) hypertension: I10

## 2019-03-14 HISTORY — DX: Anxiety disorder, unspecified: F41.9

## 2019-03-14 MED ORDER — ALPRAZOLAM 0.5 MG PO TABS
0.5000 mg | ORAL_TABLET | Freq: Once | ORAL | Status: AC
  Administered 2019-03-14: 12:00:00 0.5 mg via ORAL
  Filled 2019-03-14: qty 1

## 2019-03-14 MED ORDER — ALPRAZOLAM 0.25 MG PO TABS: 0.2500 mg | ORAL_TABLET | Freq: Three times a day (TID) | ORAL | 0 refills | Status: DC | PRN

## 2019-03-14 NOTE — ED Triage Notes (Signed)
Per EMs- patient reports that she had an anxiety attack last week and is feeling the same today. Patient c/o tingling in her hands and feet.  Patient reports that she has had 2 family members die this past week. And has had a lot of stress.

## 2019-03-14 NOTE — ED Provider Notes (Signed)
[facility] DEPT Provider Note   CSN: [id number] Arrival date & time: 03/14/19  1003    History   Chief Complaint Chief Complaint  Patient presents with  . Anxiety    HPI Melanie Castillo is a 38 y.o. female.     This is a 38 year old female history of anxiety presents after having what sounds are panic attack.  Patient states that she has been under a lot of stress recently.  Admits to drinking 6 shots of vodka last night.  Woke this morning tremulous with diffuse paresthesias.  Has been able to calm himself down.  Denies any SI or HI.  Did not use any medications prior to arrival.  Does feel better.     Past Medical History:  Diagnosis Date  . Anxiety   . Cancer ([place])   . Gestational diabetes    glyburide  . Hypertension   . Medical history non-contributory   . Vertigo     Patient Active Problem List   Diagnosis Date Noted  . Normal labor 04/13/2017  . Gestational diabetes mellitus (GDM) controlled on oral hypoglycemic drug 04/13/2017  . GBS (group B Streptococcus carrier), +RV culture, currently pregnant 04/02/2017  . Gestational diabetes mellitus (GDM) in third trimester 02/18/2017  . Supervision of normal pregnancy, antepartum 11/27/2016  . AMA (advanced maternal age) multigravida 35+ 11/27/2016    Past Surgical History:  Procedure Laterality Date  . NO PAST SURGERIES       OB History    Gravida  4   Para  4   Term  4   Preterm      AB      Living  4     SAB      TAB      Ectopic      Multiple  0   Live Births  4            Home Medications    Prior to Admission medications   Medication Sig Start Date End Date Taking? Authorizing Provider  ibuprofen (ADVIL,MOTRIN) 200 MG tablet Take 800 mg by mouth every 6 (six) hours as needed for headache.    [provider]  lidocaine (LIDODERM) 5 % Place 1 patch onto the skin daily. Remove & Discard patch within 12 hours or as directed by MD 12/20/17    Ocie Cornfield T, PA-C  naproxen sodium (ALEVE) 220 MG tablet Take 440 mg by mouth daily as needed (back pain).    [provider]  pseudoephedrine (SUDAFED) 120 MG 12 hr tablet Take 120 mg by mouth every 12 (twelve) hours as needed for congestion.    [provider]  sertraline (ZOLOFT) 50 MG tablet Take 1 tablet (50 mg total) by mouth daily. Patient not taking: Reported on 12/20/2017 05/14/17   Shelly Bombard, MD  traMADol (ULTRAM) 50 MG tablet Take 1 tablet (50 mg total) by mouth every 6 (six) hours as needed. 12/20/17   Doristine Devoid, PA-C    Family History Family History  Problem Relation Age of Onset  . Diabetes Mother   . Hypertension Mother   . Thyroid disease Mother   . Colon cancer Father   . Diabetes Other   . Hypertension Other     Social History Social History   Tobacco Use  . Smoking status: Never Smoker  . Smokeless tobacco: Never Used  Substance Use Topics  . Alcohol use: Yes    Comment: occasional  . Drug use:  No     Allergies   Patient has no known allergies.   Review of Systems Review of Systems  All other systems reviewed and are negative.    Physical Exam Updated Vital Signs BP (!) 146/91 (BP Location: Left Arm)   Pulse 84   Temp 98.4 F (36.9 C) (Oral)   Resp 18   Ht 1.626 m (5\' 4" )   Wt 107.5 kg   LMP 01/30/2019   SpO2 99%   BMI 40.68 kg/m   Physical Exam Vitals signs and nursing note reviewed.  Constitutional:      General: She is not in acute distress.    Appearance: Normal appearance. She is well-developed. She is not toxic-appearing.  HENT:     Head: Normocephalic and atraumatic.  Eyes:     General: Lids are normal.     Conjunctiva/sclera: Conjunctivae normal.     Pupils: Pupils are equal, round, and reactive to light.  Neck:     Musculoskeletal: Normal range of motion and neck supple.     Thyroid: No thyroid mass.     Trachea: No tracheal deviation.  Cardiovascular:     Rate and Rhythm: Normal  rate and regular rhythm.     Heart sounds: Normal heart sounds. No murmur. No gallop.   Pulmonary:     Effort: Pulmonary effort is normal. No respiratory distress.     Breath sounds: Normal breath sounds. No stridor. No decreased breath sounds, wheezing, rhonchi or rales.  Abdominal:     General: Bowel sounds are normal. There is no distension.     Palpations: Abdomen is soft.     Tenderness: There is no abdominal tenderness. There is no rebound.  Musculoskeletal: Normal range of motion.        General: No tenderness.  Skin:    General: Skin is warm and dry.     Findings: No abrasion or rash.  Neurological:     Mental Status: She is alert and oriented to person, place, and time.     GCS: GCS eye subscore is 4. GCS verbal subscore is 5. GCS motor subscore is 6.     Cranial Nerves: No cranial nerve deficit.     Sensory: No sensory deficit.  Psychiatric:        Attention and Perception: Attention normal.        Mood and Affect: Mood is anxious.        Speech: Speech normal.        Behavior: Behavior normal.      ED Treatments / Results  Labs (all labs ordered are listed, but only abnormal results are displayed) Labs Reviewed - No data to display  EKG None  Radiology No results found.  Procedures Procedures (including critical care time)  Medications Ordered in ED Medications  ALPRAZolam (XANAX) tablet 0.5 mg (has no administration in time range)     Initial Impression / Assessment and Plan / ED Course  I have reviewed the triage vital signs and the nursing notes.  Pertinent labs & imaging results that were available during my care of the patient were reviewed by me and considered in my medical decision making (see chart for details).        Patient treated for anxiety here with Xanax and will provide PRN prescription.  Return precautions given  Final Clinical Impressions(s) / ED Diagnoses   Final diagnoses:  None    ED Discharge Orders    None  Lacretia Leigh, MD 03/14/19 1209

## 2019-03-14 NOTE — ED Notes (Signed)
Patient unable to sign due to [facility] not working.

## 2019-03-14 NOTE — ED Notes (Signed)
Bed: [facility] Expected date:  Expected time:  Means of arrival:  Comments: 

## 2019-04-22 DIAGNOSIS — F411 Generalized anxiety disorder: Secondary | ICD-10-CM | POA: Insufficient documentation

## 2020-03-19 ENCOUNTER — Emergency Department (HOSPITAL_COMMUNITY)
Admission: EM | Admit: 2020-03-19 | Discharge: 2020-03-20 | Disposition: A | Discharge status: Home / Self Care | Payer: BC Managed Care – PPO | Attending: Emergency Medicine | Admitting: Emergency Medicine

## 2020-03-19 ENCOUNTER — Other Ambulatory Visit: Payer: Self-pay

## 2020-03-19 ENCOUNTER — Emergency Department (HOSPITAL_COMMUNITY): Payer: BC Managed Care – PPO

## 2020-03-19 ENCOUNTER — Encounter (HOSPITAL_COMMUNITY): Payer: Self-pay | Admitting: Emergency Medicine

## 2020-03-19 DIAGNOSIS — R52 Pain, unspecified: Secondary | ICD-10-CM

## 2020-03-19 DIAGNOSIS — X509XXA Other and unspecified overexertion or strenuous movements or postures, initial encounter: Secondary | ICD-10-CM | POA: Diagnosis not present

## 2020-03-19 DIAGNOSIS — Z733 Stress, not elsewhere classified: Secondary | ICD-10-CM | POA: Diagnosis not present

## 2020-03-19 DIAGNOSIS — I10 Essential (primary) hypertension: Secondary | ICD-10-CM | POA: Insufficient documentation

## 2020-03-19 DIAGNOSIS — S99912A Unspecified injury of left ankle, initial encounter: Secondary | ICD-10-CM | POA: Diagnosis present

## 2020-03-19 DIAGNOSIS — Y9389 Activity, other specified: Secondary | ICD-10-CM | POA: Diagnosis not present

## 2020-03-19 DIAGNOSIS — S93402A Sprain of unspecified ligament of left ankle, initial encounter: Secondary | ICD-10-CM | POA: Diagnosis not present

## 2020-03-19 DIAGNOSIS — Z859 Personal history of malignant neoplasm, unspecified: Secondary | ICD-10-CM | POA: Diagnosis not present

## 2020-03-19 DIAGNOSIS — F410 Panic disorder [episodic paroxysmal anxiety] without agoraphobia: Secondary | ICD-10-CM | POA: Diagnosis not present

## 2020-03-19 DIAGNOSIS — Y999 Unspecified external cause status: Secondary | ICD-10-CM | POA: Insufficient documentation

## 2020-03-19 DIAGNOSIS — Y929 Unspecified place or not applicable: Secondary | ICD-10-CM | POA: Insufficient documentation

## 2020-03-19 NOTE — ED Triage Notes (Signed)
Per EMS, pt from home, reports anxiety throughout the day.    160/90 HR 130 100% RA  Pt reports hx of panic attacks, was having one today.  She reports she has been out of medication for a few months.  Pt also reports left ankle pain, she "twisted" her ankle when she went to stand up.  Denies any SI/HI.

## 2020-03-20 MED ORDER — ALPRAZOLAM 0.25 MG PO TABS: 0.2500 mg | ORAL_TABLET | Freq: Three times a day (TID) | ORAL | 0 refills | Status: AC | PRN

## 2020-03-20 NOTE — ED Notes (Signed)
Pt verbalized understanding of d/c instructions, follow up and medications. Pt taken to [facility] via [facility]. No further questions.

## 2020-03-20 NOTE — ED Provider Notes (Signed)
[facility] EMERGENCY DEPARTMENT Provider Note   CSN: [id number] Arrival date & time: 03/19/20  2017     History Chief Complaint  Patient presents with  . Panic Attack  . Ankle Pain    Melanie Castillo is a 39 y.o. female.  The history is provided by the patient.  Ankle Pain Location:  Ankle Pain details:    Quality:  Aching   Severity:  Mild   Onset quality:  Sudden   Timing:  Constant   Progression:  Unchanged Chronicity:  New Associated symptoms: no fever   Anxiety This is a new problem. The problem has been gradually improving. Associated symptoms include shortness of breath. Nothing aggravates the symptoms. Nothing relieves the symptoms.   Patient reports she is under severe stress at home due to family stressors.  She reports during the day she had a significant panic attack.  She reports that she feels short of breath and her heart was racing.  She had no medications at home to use.  She reports used to be on Celexa and Xanax as needed for panic attacks.  However she was able to wean herself off of the Celexa and has no Xanax at home.  She denies SI.  Patient reports she stood up too fast earlier and may have sprained her left ankle.  No other injuries reported    Past Medical History:  Diagnosis Date  . Anxiety   . Cancer ([place])   . Gestational diabetes    glyburide  . Hypertension   . Medical history non-contributory   . Vertigo     Patient Active Problem List   Diagnosis Date Noted  . Normal labor 04/13/2017  . Gestational diabetes mellitus (GDM) controlled on oral hypoglycemic drug 04/13/2017  . GBS (group B Streptococcus carrier), +RV culture, currently pregnant 04/02/2017  . Gestational diabetes mellitus (GDM) in third trimester 02/18/2017  . Supervision of normal pregnancy, antepartum 11/27/2016  . AMA (advanced maternal age) multigravida 35+ 11/27/2016    Past Surgical History:  Procedure Laterality Date  . NO PAST SURGERIES        OB History    Gravida  4   Para  4   Term  4   Preterm      AB      Living  4     SAB      TAB      Ectopic      Multiple  0   Live Births  4           Family History  Problem Relation Age of Onset  . Diabetes Mother   . Hypertension Mother   . Thyroid disease Mother   . Colon cancer Father   . Diabetes Other   . Hypertension Other     Social History   Tobacco Use  . Smoking status: Never Smoker  . Smokeless tobacco: Never Used  Substance Use Topics  . Alcohol use: Yes    Comment: occasional  . Drug use: No    Home Medications Prior to Admission medications   Medication Sig Start Date End Date Taking? Authorizing Provider  ALPRAZolam (XANAX) 0.25 MG tablet Take 1 tablet (0.25 mg total) by mouth 3 (three) times daily as needed for anxiety. 03/20/20   Ripley Fraise, MD  ibuprofen (ADVIL,MOTRIN) 200 MG tablet Take 800 mg by mouth every 6 (six) hours as needed for headache.    [provider]  lidocaine (LIDODERM) 5 % Place  1 patch onto the skin daily. Remove & Discard patch within 12 hours or as directed by MD 12/20/17   Ocie Cornfield T, PA-C  naproxen sodium (ALEVE) 220 MG tablet Take 440 mg by mouth daily as needed (back pain).    [provider]  pseudoephedrine (SUDAFED) 120 MG 12 hr tablet Take 120 mg by mouth every 12 (twelve) hours as needed for congestion.    [provider]  traMADol (ULTRAM) 50 MG tablet Take 1 tablet (50 mg total) by mouth every 6 (six) hours as needed. 12/20/17   Doristine Devoid, PA-C  sertraline (ZOLOFT) 50 MG tablet Take 1 tablet (50 mg total) by mouth daily. Patient not taking: Reported on 12/20/2017 05/14/17 03/20/20  Shelly Bombard, MD    Allergies    Patient has no known allergies.  Review of Systems   Review of Systems  Constitutional: Negative for fever.  Respiratory: Positive for shortness of breath.   Musculoskeletal: Positive for arthralgias.  Psychiatric/Behavioral:  Negative for suicidal ideas. The patient is nervous/anxious.   All other systems reviewed and are negative.   Physical Exam Updated Vital Signs BP (!) 124/96   Pulse 99   Temp 98.2 F (36.8 C) (Oral)   Resp 16   Ht 1.626 m (5\' 4" )   Wt 107.5 kg   SpO2 100%   BMI 40.68 kg/m   Physical Exam CONSTITUTIONAL: Well developed/well nourished HEAD: Normocephalic/atraumatic EYES: EOMI/PERRL ENMT: Mucous membranes moist NECK: supple no meningeal signs SPINE/BACK:entire spine nontender CV: S1/S2 noted, no murmurs/rubs/gallops noted LUNGS: Lungs are clear to auscultation bilaterally, no apparent distress ABDOMEN: soft, nontender NEURO: Pt is awake/alert/appropriate, moves all extremitiesx4.  No facial droop.  Patient is ambulatory EXTREMITIES: pulses normal/equal, full ROM, mild tenderness to left ankle, no deformities, distal pulses intact SKIN: warm, color normal PSYCH: no abnormalities of mood noted, alert and oriented to situation  ED Results / Procedures / Treatments   Labs (all labs ordered are listed, but only abnormal results are displayed) Labs Reviewed - No data to display  EKG None  Radiology DG Ankle Left Port  Result Date: 03/19/2020 CLINICAL DATA:  Pain EXAM: PORTABLE LEFT ANKLE - 2 VIEW COMPARISON:  None. FINDINGS: Mild soft tissue swelling about the ankle. No acute fracture or traumatic malalignment is evident. No sizable ankle joint effusion. Minimal degenerative spurring upon the medial and lateral malleoli. Additional degenerative changes noted at the calcaneonavicular and naviculocuneiform articulations as well as a chronic appearing deformity of the fifth metatarsal, incompletely assessed on this exam. Small posterior calcaneal spur. No soft tissue gas or foreign body. No abnormal erosions, destructive change or other radiographic evidence of osteomyelitis. IMPRESSION: 1. Mild soft tissue swelling about the ankle. No acute osseous abnormality. 2. Degenerative changes  noted at the hindfoot and midfoot, incompletely assessed on this exam. 3. Chronic appearing deformity of the fifth metatarsal, correlate with history. 4. Small posterior calcaneal spur. Electronically Signed   By: Lovena Le M.D.   On: 03/19/2020 22:50    Procedures Procedures    Medications Ordered in ED Medications - No data to display  ED Course  I have reviewed the triage vital signs and the nursing notes.  Pertinent  imaging results that were available during my care of the patient were reviewed by me and considered in my medical decision making (see chart for details).    MDM Rules/Calculators/A&P  Patient with panic attacks at home.  She reports this is a very significant episode.  She denies SI. Will give short course of Xanax, advised follow-up PCP.  Patient appears safe for discharge  Final Clinical Impression(s) / ED Diagnoses Final diagnoses:  Pain  Panic attack  Sprain of left ankle, unspecified ligament, initial encounter    Rx / DC Orders ED Discharge Orders         Ordered    ALPRAZolam (XANAX) 0.25 MG tablet  3 times daily PRN     03/20/20 0226           Ripley Fraise, MD 03/20/20 0320

## 2020-03-20 NOTE — ED Notes (Signed)
Pt was able to ambulate around her room with slight limp, pt states this is normal issue for her.

## 2022-06-07 DIAGNOSIS — Z800 Family history of malignant neoplasm of digestive organs: Secondary | ICD-10-CM | POA: Insufficient documentation

## 2022-06-07 DIAGNOSIS — E66813 Obesity, class 3: Secondary | ICD-10-CM | POA: Insufficient documentation

## 2023-08-26 ENCOUNTER — Emergency Department (HOSPITAL_COMMUNITY): Payer: No Typology Code available for payment source

## 2023-08-26 ENCOUNTER — Other Ambulatory Visit: Payer: Self-pay

## 2023-08-26 ENCOUNTER — Emergency Department (HOSPITAL_COMMUNITY)
Admission: EM | Admit: 2023-08-26 | Discharge: 2023-08-26 | Disposition: A | Discharge status: Home / Self Care | Payer: No Typology Code available for payment source | Attending: Emergency Medicine | Admitting: Emergency Medicine

## 2023-08-26 DIAGNOSIS — M542 Cervicalgia: Secondary | ICD-10-CM | POA: Diagnosis present

## 2023-08-26 DIAGNOSIS — S161XXA Strain of muscle, fascia and tendon at neck level, initial encounter: Secondary | ICD-10-CM | POA: Diagnosis not present

## 2023-08-26 DIAGNOSIS — Y92410 Unspecified street and highway as the place of occurrence of the external cause: Secondary | ICD-10-CM | POA: Insufficient documentation

## 2023-08-26 DIAGNOSIS — I10 Essential (primary) hypertension: Secondary | ICD-10-CM | POA: Diagnosis not present

## 2023-08-26 MED ORDER — CYCLOBENZAPRINE HCL 10 MG PO TABS: 10.0000 mg | ORAL_TABLET | Freq: Two times a day (BID) | ORAL | 0 refills | Status: AC | PRN

## 2023-08-26 MED ORDER — CYCLOBENZAPRINE HCL 10 MG PO TABS
10.0000 mg | ORAL_TABLET | Freq: Once | ORAL | Status: AC
  Administered 2023-08-26: 10 mg via ORAL
  Filled 2023-08-26: qty 1
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
## 2024-06-22 ENCOUNTER — Other Ambulatory Visit: Payer: Self-pay | Admitting: Nurse Practitioner

## 2024-06-22 ENCOUNTER — Encounter: Payer: Self-pay | Admitting: Nurse Practitioner

## 2024-06-22 DIAGNOSIS — Z1231 Encounter for screening mammogram for malignant neoplasm of breast: Secondary | ICD-10-CM

## 2024-06-23 LAB — CYTOLOGY - PAP: Pap: NEGATIVE

## 2024-06-28 ENCOUNTER — Ambulatory Visit
Admission: RE | Admit: 2024-06-28 | Discharge: 2024-06-28 | Disposition: A | Discharge status: Home / Self Care | Source: Ambulatory Visit | Attending: Nurse Practitioner | Admitting: Nurse Practitioner

## 2024-06-28 DIAGNOSIS — Z1231 Encounter for screening mammogram for malignant neoplasm of breast: Secondary | ICD-10-CM

## 2024-08-27 ENCOUNTER — Encounter: Payer: Self-pay | Admitting: *Deleted

## 2024-08-30 DIAGNOSIS — O09299 Supervision of pregnancy with other poor reproductive or obstetric history, unspecified trimester: Secondary | ICD-10-CM | POA: Insufficient documentation

## 2024-09-02 ENCOUNTER — Telehealth

## 2024-09-02 DIAGNOSIS — Z3A26 26 weeks gestation of pregnancy: Secondary | ICD-10-CM

## 2024-09-02 DIAGNOSIS — O09292 Supervision of pregnancy with other poor reproductive or obstetric history, second trimester: Secondary | ICD-10-CM

## 2024-09-02 DIAGNOSIS — Z8279 Family history of other congenital malformations, deformations and chromosomal abnormalities: Secondary | ICD-10-CM | POA: Insufficient documentation

## 2024-09-02 DIAGNOSIS — Z3480 Encounter for supervision of other normal pregnancy, unspecified trimester: Secondary | ICD-10-CM | POA: Insufficient documentation

## 2024-09-02 DIAGNOSIS — O09299 Supervision of pregnancy with other poor reproductive or obstetric history, unspecified trimester: Secondary | ICD-10-CM

## 2024-09-02 MED ORDER — TRINATE PO TABS: 1.0000 | ORAL_TABLET | Freq: Every day | ORAL | 11 refills | Status: AC

## 2024-09-02 NOTE — Progress Notes (Addendum)
 New OB Intake  I connected with Melanie Castillo  on 09/02/24 at 10:15 AM EST by MyChart Video Visit and verified that I am speaking with the correct person using two identifiers. Nurse is located at [facility] and pt is located at her work office.  I discussed the limitations, risks, security and privacy concerns of performing an evaluation and management service by telephone and the availability of in person appointments. I also discussed with the patient that there may be a patient responsible charge related to this service. The patient expressed understanding and agreed to proceed.  I explained I am completing New OB Intake today. We discussed EDD of 12/09/24 based on US  at 23.4 weeks with Melanie Castillo, see care everywhere. Pt is [id number]. I reviewed her allergies, medications and Medical/Surgical/OB history.    Patient Active Problem List   Diagnosis Date Noted   Family history of Down syndrome 09/02/2024   Supervision of other normal pregnancy, antepartum 09/02/2024   History of gestational diabetes in prior pregnancy, currently pregnant 08/30/2024   GAD (generalized anxiety disorder) 04/22/2019   AMA (advanced maternal age) multigravida 35+ 11/27/2016   Maternal obesity affecting pregnancy, antepartum 10/17/2016     Concerns addressed today Patient reports her oldest son has down syndrome, desires genetic screenings.  She is considering adoption.  Prenatal vitamins sent to pharmacy.   Delivery Plans Plans to deliver at [facility]. Discussed the nature of our practice with multiple providers including residents and students as well as female and female providers. Due to the size of the practice, the delivering provider may not be the same as those providing prenatal care.   Patient undecided on interest in water birth.  MyChart/Babyscripts MyChart access verified. I explained pt will have some visits in office and some virtually. Babyscripts instructions given and order placed. Patient verifies  receipt of registration text/e-mail. Account successfully created and app downloaded. If patient is a candidate for Optimized scheduling, add to sticky note.   Blood Pressure Cuff/Weight Scale Patient has blood pressure. Explained after first prenatal appt pt will check weekly and document in Babyscripts. Patient does have weight scale.   Anatomy US  Explained first scheduled US  will be around 19 weeks. Anatomy US  scheduled for 10/27/24 at 08:00am.  MFM added pt to wait list for sooner appointment.  Is patient a CenteringPregnancy candidate?  Declined due to Group setting.  Is patient a Mom+Baby Combined Care candidate?  Not a candidate    Is patient a candidate for Babyscripts Optimization? No   First visit review I reviewed new OB appt with patient. Explained pt will be seen by Hoyle, NP at first visit 09/08/24 at 09:55 . Discussed Melanie Castillo genetic screening with patient. Desires Panorama. Routine prenatal labs needed at [facility] Visit.   Last Pap Patient reports last pap smear was a month ago with [facility], need to obtain records.  Reports history abnormal pap smears.  Melanie LITTIE Burows, RN 09/02/2024  5:23 PM

## 2024-09-02 NOTE — Patient Instructions (Addendum)

## 2024-09-08 ENCOUNTER — Other Ambulatory Visit: Payer: Self-pay

## 2024-09-08 ENCOUNTER — Encounter: Payer: Self-pay | Admitting: Student

## 2024-09-08 ENCOUNTER — Ambulatory Visit (INDEPENDENT_AMBULATORY_CARE_PROVIDER_SITE_OTHER): Admitting: Student

## 2024-09-08 ENCOUNTER — Other Ambulatory Visit (HOSPITAL_COMMUNITY)
Admission: RE | Admit: 2024-09-08 | Discharge: 2024-09-08 | Disposition: A | Discharge status: Home / Self Care | Source: Ambulatory Visit | Attending: Student | Admitting: Student

## 2024-09-08 VITALS — BP 111/76 | HR 98 | Wt 270.0 lb

## 2024-09-08 DIAGNOSIS — O99212 Obesity complicating pregnancy, second trimester: Secondary | ICD-10-CM

## 2024-09-08 DIAGNOSIS — O09523 Supervision of elderly multigravida, third trimester: Secondary | ICD-10-CM

## 2024-09-08 DIAGNOSIS — Z3A26 26 weeks gestation of pregnancy: Secondary | ICD-10-CM

## 2024-09-08 DIAGNOSIS — Z8632 Personal history of gestational diabetes: Secondary | ICD-10-CM

## 2024-09-08 DIAGNOSIS — F411 Generalized anxiety disorder: Secondary | ICD-10-CM

## 2024-09-08 DIAGNOSIS — O09292 Supervision of pregnancy with other poor reproductive or obstetric history, second trimester: Secondary | ICD-10-CM

## 2024-09-08 DIAGNOSIS — Z3480 Encounter for supervision of other normal pregnancy, unspecified trimester: Secondary | ICD-10-CM | POA: Insufficient documentation

## 2024-09-08 DIAGNOSIS — O99210 Obesity complicating pregnancy, unspecified trimester: Secondary | ICD-10-CM

## 2024-09-08 DIAGNOSIS — O09299 Supervision of pregnancy with other poor reproductive or obstetric history, unspecified trimester: Secondary | ICD-10-CM

## 2024-09-08 DIAGNOSIS — O09522 Supervision of elderly multigravida, second trimester: Secondary | ICD-10-CM | POA: Diagnosis not present

## 2024-09-08 DIAGNOSIS — Z3009 Encounter for other general counseling and advice on contraception: Secondary | ICD-10-CM

## 2024-09-08 DIAGNOSIS — Z8279 Family history of other congenital malformations, deformations and chromosomal abnormalities: Secondary | ICD-10-CM | POA: Diagnosis not present

## 2024-09-08 DIAGNOSIS — I10 Essential (primary) hypertension: Secondary | ICD-10-CM | POA: Insufficient documentation

## 2024-09-08 NOTE — Progress Notes (Signed)
 INITIAL PRENATAL VISIT  History:  Melanie Castillo is a 43 y.o. [id number] at [redacted]w[redacted]d by ultrasound being seen today for her first obstetrical visit.  Her obstetrical history is significant for h/o of GDM and family history of downs syndrome. Patient does not intend to breast feed. Pregnancy history fully reviewed. Patient planning for adoption.   Patient reports no complaints.  HISTORY: OB History  Gravida Para Term Preterm AB Living  5 4 4  0 0 4  SAB IAB Ectopic Multiple Live Births  0 0 0 0 4    # Outcome Date GA Lbr Len/2nd Weight Sex Type Anes PTL Lv  5 Current           4 Term 04/13/17 [redacted]w[redacted]d 07:54 / 00:11 7 lb 13 oz (3.544 kg) F Vag-Spont EPI  LIV     Name: Melanie Castillo     Apgar1: 8  Apgar5: 9  3 Term 05/18/09 [redacted]w[redacted]d   M Vag-Spont None  LIV  2 Term 07/05/08 [redacted]w[redacted]d   F Vag-Spont EPI  LIV  1 Term 03/16/95 [redacted]w[redacted]d   M Vag-Spont EPI N LIV    Last pap smear was done 06/2024 and was abnormal - Unknown   Past Medical History:  Diagnosis Date   Anxiety    Cancer (HCC)    Gestational diabetes    glyburide    Hypertension    Pregnancy 2018   Medical history non-contributory    Vertigo    Past Surgical History:  Procedure Laterality Date   NO PAST SURGERIES     Family History  Problem Relation Age of Onset   Diabetes Mother    Hypertension Mother    Thyroid disease Mother    Cancer Mother    Colon cancer Father    Down syndrome Son    Seizures Son    Diabetes Other    Hypertension Other    Breast cancer Neg Hx    Social History   Tobacco Use   Smoking status: Never   Smokeless tobacco: Never  Vaping Use   Vaping status: Never Used  Substance Use Topics   Alcohol use: Not Currently    Comment: before knew about pregnancy   Drug use: No   Allergies  Allergen Reactions   Wasp Venom Anaphylaxis   Current Outpatient Medications on File Prior to Visit  Medication Sig Dispense Refill   ALPRAZolam  (XANAX ) 0.25 MG tablet Take 1 tablet (0.25 mg total) by  mouth 3 (three) times daily as needed for anxiety. 10 tablet 0   Prenatal Vit-Fe Fumarate-FA (TRINATE) TABS Take 1 tablet by mouth daily. (Patient not taking: Reported on 09/08/2024) 28 tablet 11   No current facility-administered medications on file prior to visit.    Review of Systems Pertinent items noted in HPI and remainder of comprehensive ROS otherwise negative.   Physical Exam:   Vitals:   09/08/24 1011  BP: 111/76  Pulse: 98  Weight: 270 lb (122.5 kg)   Fetal Heart Rate (bpm): 143  Uterine size:  Gravid   General: well-developed, well-nourished female in no acute distress  Breasts:  Not indicated  Skin: normal coloration and turgor, no rashes  Neurologic: oriented, normal, negative, normal mood  Extremities: normal strength, tone, and muscle mass, ROM of all joints is normal  HEENT PERRLA, extraocular movement intact and sclera clear, anicteric  Neck supple and no masses  Cardiovascular: regular rate and rhythm  Respiratory:  no respiratory distress, normal breath sounds  Abdomen: soft, non-tender; bowel  sounds normal; no masses,  no organomegaly  Pelvic: Not indicated   Assessment:  Pregnancy: [id number] Patient Active Problem List   Diagnosis Date Noted   Family history of Down syndrome 09/02/2024   Supervision of other normal pregnancy, antepartum 09/02/2024   History of gestational diabetes in prior pregnancy, currently pregnant 08/30/2024   GAD (generalized anxiety disorder) 04/22/2019   AMA (advanced maternal age) multigravida 35+ 11/27/2016   Maternal obesity affecting pregnancy, antepartum 10/17/2016    Plan:  1. Supervision of other normal pregnancy, antepartum (Primary) - Culture, OB Urine - GC/Chlamydia probe amp ([place])not at [facility] - CBC/D/Plt+RPR+Rh+ABO+RubIgG... - Hemoglobin A1c - Comprehensive metabolic panel with GFR - Protein / creatinine ratio, urine - HORIZON Basic Panel - PANORAMA PRENATAL TEST  2. [redacted] weeks gestation of  pregnancy - anatomy scan scheduled  3. Multigravida of advanced maternal age in third trimester - Detailed anatomy scan scheduled  4. History of gestational diabetes in prior pregnancy, currently pregnant - GTT at next visit - 2018, no medications   5. Family history of Down syndrome - genetic testing today  6. Obesity affecting pregnancy, antepartum, unspecified obesity type - detailed anatomy scan scheduled  7. GAD (generalized anxiety disorder) - 2x/week taking Xanax  - IBH consult   8. Unwanted fertility - desires BTL   Initial labs drawn. Continue prenatal vitamins. Problem list reviewed and updated. Genetic Screening discussed, Panorama and Horizon: ordered. Ultrasound discussed; fetal anatomic survey: scheduled. Anticipatory guidance about prenatal visits given including labs, ultrasounds, and testing. Weight gain recommendations per IOM guidelines reviewed: underweight/BMI 18.5 or less > 28 - 40 lbs; normal weight/BMI 18.5 - 24.9 > 25 - 35 lbs; overweight/BMI 25 - 29.9 > 15 - 25 lbs; obese/BMI 30 or more > 11 - 20 lbs. Discussed usage of the Babyscripts app for more information about pregnancy, and to track blood pressures. Also discussed usage of virtual visits as additional source of managing and completing prenatal visits.  Patient was encouraged to use MyChart to review results, send requests, and have questions addressed.   The nature of [facility]Faculty Practice with multiple MDs and Advanced Practice Providers was explained to patient; also emphasized that residents, students are part of our team. Routine obstetric precautions reviewed. Encouraged to seek out care at our office or emergency room [facility] preferred) for urgent and/or emergent concerns. No follow-ups on file.     [name], MD, FACOG Obstetrician & Gynecologist, [facility] for [employer], [facility]

## 2024-09-09 LAB — CBC/D/PLT+RPR+RH+ABO+RUBIGG...
Antibody Screen: NEGATIVE
Basophils Absolute: 0 x10E3/uL (ref 0.0–0.2)
Basos: 0 %
EOS (ABSOLUTE): 0.2 x10E3/uL (ref 0.0–0.4)
Eos: 2 %
HCV Ab: NONREACTIVE
HIV Screen 4th Generation wRfx: NONREACTIVE
Hematocrit: 33 % — ABNORMAL LOW (ref 34.0–46.6)
Hemoglobin: 10.5 g/dL — ABNORMAL LOW (ref 11.1–15.9)
Hepatitis B Surface Ag: NEGATIVE
Immature Grans (Abs): 0.1 x10E3/uL (ref 0.0–0.1)
Immature Granulocytes: 1 %
Lymphocytes Absolute: 1.3 x10E3/uL (ref 0.7–3.1)
Lymphs: 13 %
MCH: 25 pg — ABNORMAL LOW (ref 26.6–33.0)
MCHC: 31.8 g/dL (ref 31.5–35.7)
MCV: 79 fL (ref 79–97)
Monocytes Absolute: 0.7 x10E3/uL (ref 0.1–0.9)
Monocytes: 7 %
Neutrophils Absolute: 7.5 x10E3/uL — ABNORMAL HIGH (ref 1.4–7.0)
Neutrophils: 77 %
Platelets: 228 x10E3/uL (ref 150–450)
RBC: 4.2 x10E6/uL (ref 3.77–5.28)
RDW: 13.5 % (ref 11.7–15.4)
RPR Ser Ql: NONREACTIVE
Rh Factor: POSITIVE
Rubella Antibodies, IGG: 1.4 {index} (ref >0.99)
WBC: 9.8 x10E3/uL (ref 3.4–10.8)

## 2024-09-09 LAB — COMPREHENSIVE METABOLIC PANEL WITH GFR
ALT: 7 IU/L (ref 0–32)
AST: 11 IU/L (ref 0–40)
Albumin: 3.5 g/dL — ABNORMAL LOW (ref 3.9–4.9)
Alkaline Phosphatase: 56 IU/L (ref 41–116)
BUN/Creatinine Ratio: 6 — ABNORMAL LOW (ref 9–23)
BUN: 3 mg/dL — ABNORMAL LOW (ref 6–24)
Bilirubin Total: 0.2 mg/dL (ref 0.0–1.2)
CO2: 17 mmol/L — ABNORMAL LOW (ref 20–29)
Calcium: 9 mg/dL (ref 8.7–10.2)
Chloride: 103 mmol/L (ref 96–106)
Creatinine, Ser: 0.54 mg/dL — ABNORMAL LOW (ref 0.57–1.00)
Globulin, Total: 3.5 g/dL (ref 1.5–4.5)
Glucose: 91 mg/dL (ref 70–99)
Potassium: 3.9 mmol/L (ref 3.5–5.2)
Sodium: 135 mmol/L (ref 134–144)
Total Protein: 7 g/dL (ref 6.0–8.5)
eGFR: 116 mL/min/1.73 (ref >59)

## 2024-09-09 LAB — HCV INTERPRETATION

## 2024-09-09 LAB — HEMOGLOBIN A1C
Est. average glucose Bld gHb Est-mCnc: 120 mg/dL
Hgb A1c MFr Bld: 5.8 % — ABNORMAL HIGH (ref 4.8–5.6)

## 2024-09-10 LAB — GC/CHLAMYDIA PROBE AMP (~~LOC~~) NOT AT ARMC
Chlamydia: NEGATIVE
Comment: NEGATIVE
Comment: NORMAL
Neisseria Gonorrhea: NEGATIVE

## 2024-09-11 LAB — CULTURE, OB URINE

## 2024-09-11 LAB — URINE CULTURE, OB REFLEX

## 2024-09-15 LAB — PANORAMA PRENATAL TEST FULL PANEL:PANORAMA TEST PLUS 5 ADDITIONAL MICRODELETIONS: FETAL FRACTION: 15.9

## 2024-09-16 ENCOUNTER — Encounter: Payer: Self-pay | Admitting: *Deleted

## 2024-09-18 LAB — HORIZON CUSTOM: REPORT SUMMARY: POSITIVE — AB

## 2024-09-20 ENCOUNTER — Other Ambulatory Visit: Payer: Self-pay

## 2024-09-20 ENCOUNTER — Other Ambulatory Visit

## 2024-09-20 DIAGNOSIS — O09299 Supervision of pregnancy with other poor reproductive or obstetric history, unspecified trimester: Secondary | ICD-10-CM

## 2024-09-20 DIAGNOSIS — Z0289 Encounter for other administrative examinations: Secondary | ICD-10-CM

## 2024-09-20 DIAGNOSIS — Z3A26 26 weeks gestation of pregnancy: Secondary | ICD-10-CM

## 2024-09-21 LAB — GLUCOSE TOLERANCE, 2 HOURS W/ 1HR
Glucose, 1 hour: 194 mg/dL — ABNORMAL HIGH (ref 70–179)
Glucose, 2 hour: 164 mg/dL — ABNORMAL HIGH (ref 70–152)
Glucose, Fasting: 102 mg/dL — ABNORMAL HIGH (ref 70–91)

## 2024-09-22 ENCOUNTER — Other Ambulatory Visit: Payer: Self-pay

## 2024-09-22 ENCOUNTER — Ambulatory Visit

## 2024-09-22 VITALS — BP 126/83 | HR 97 | Wt 270.0 lb

## 2024-09-22 DIAGNOSIS — O09523 Supervision of elderly multigravida, third trimester: Secondary | ICD-10-CM

## 2024-09-22 DIAGNOSIS — Z3009 Encounter for other general counseling and advice on contraception: Secondary | ICD-10-CM

## 2024-09-22 DIAGNOSIS — Z3493 Encounter for supervision of normal pregnancy, unspecified, third trimester: Secondary | ICD-10-CM

## 2024-09-22 DIAGNOSIS — O24419 Gestational diabetes mellitus in pregnancy, unspecified control: Secondary | ICD-10-CM

## 2024-09-22 DIAGNOSIS — O0990 Supervision of high risk pregnancy, unspecified, unspecified trimester: Secondary | ICD-10-CM

## 2024-09-22 DIAGNOSIS — O0993 Supervision of high risk pregnancy, unspecified, third trimester: Secondary | ICD-10-CM

## 2024-09-22 DIAGNOSIS — D563 Thalassemia minor: Secondary | ICD-10-CM | POA: Insufficient documentation

## 2024-09-22 DIAGNOSIS — Z3A28 28 weeks gestation of pregnancy: Secondary | ICD-10-CM

## 2024-09-22 MED ORDER — GLUCOSE BLOOD VI STRP: ORAL_STRIP | 12 refills | Status: AC

## 2024-09-22 MED ORDER — ASPIRIN 81 MG PO TBEC: 81.0000 mg | DELAYED_RELEASE_TABLET | Freq: Every day | ORAL | 0 refills | Status: AC

## 2024-09-22 MED ORDER — ACCU-CHEK GUIDE W/DEVICE KIT: 1.0000 | PACK | Freq: Four times a day (QID) | 0 refills | Status: DC

## 2024-09-22 MED ORDER — ACCU-CHEK SOFTCLIX LANCETS MISC: 12 refills | Status: AC

## 2024-09-22 NOTE — Addendum Note (Signed)
 Addended by: DANNY GERALDS on: 09/22/2024 02:33 PM   Modules accepted: Orders

## 2024-09-22 NOTE — Assessment & Plan Note (Addendum)
 No FOB testing.

## 2024-09-22 NOTE — Assessment & Plan Note (Addendum)
 BP, FHT, and FH appropriate. Candidiate for ASA81, called pt after appt to discuss indication. She is agreeable, rx sent. Updated to high risk preg in light of new gDM dx. Late to care, anatomy scan is scheduled.

## 2024-09-22 NOTE — Patient Instructions (Addendum)
 Please go to the Maternity Assessment Unit (MAU) at [facility] Entr[facility] if you experience vaginal bleeding, leaking/gush of fluid like your water broke, notice decreased movement from your baby after doing kick counts, or if you have contractions the are becoming more intense or more frequent.   Fetal Movement Counts When you're pregnant, you might start feeling your baby move around the middle of your pregnancy. At first, these movements might feel like flutters, rolls, or swishes. As your baby grows, you might feel more kicks and jabs. Around week 28 of your pregnancy, your health care team may ask you to count how often your baby moves. This is important for all pregnancies, but especially for high-risk ones. Counting movements can help lessen the risk of stillbirth. Do this whenever you feel that you baby has been moving less than usual.  What is a fetal movement count? A fetal movement count is the number of times that you feel your baby move during a certain amount of time. This may also be called a kick count. There are many ways to do a kick count. Ask your team what is best for you. Pay attention to when your baby is most active. You may notice your baby's sleep and wake cycles. You may also notice things that make your baby move more. When you do a kick count, try to do it: When your baby is normally most active. At the same time each day.  How do I count fetal movements? Find a quiet, comfortable area. Sit or lie down. Write down the date, the start time, and the number of movements you feel. Count kicks, flutters, swishes, rolls, and jabs. Usually, you will feel at least 10 movements within 2 hours. Stop counting after you have felt 10 movements or if you have been counting for 2 hours. Write down the stop time.  Contact a health care provider if:  You don't feel 10 movements in 2 hours. Your baby isn't moving as it usually does. Your baby isn't moving at all. If you're  not able to reach your provider, go to an emergency room. This information is not intended to replace advice given to you by your health care provider. Make sure you discuss any questions you have with your health care provider.  Document Revised: 10/24/2023 Document Reviewed: 10/16/2022 Elsevier Patient Education  2025 [employer].  Signs and Symptoms of Labor Labor is the body's natural process of moving the baby and the placenta out of the uterus. The process of labor usually starts when the baby is full-term, 37 weeks and 0 days or more.   As your body prepares for labor and the birth of your baby, you may notice the following symptoms in the weeks and days before true labor starts: Your baby dropping lower into your pelvis to get into position for birth (lightening). When this happens, you may feel more pressure on your bladder and pelvic bone and less pressure on your ribs. This may make it easier to breathe. It may also cause you to need to urinate more often and have problems with bowel movements. Having practice contractions, also called Braxton Hicks contractions or false labor. These occur at irregular (unevenly spaced) intervals that are more than 10 minutes apart. False labor contractions are common after exercise or sexual activity. They will stop if you change position, rest, or drink fluids. These contractions are usually mild and do not get stronger over time. They may feel like: A backache  or back pain. Mild cramps, similar to menstrual cramps. Tightening or pressure in your abdomen. Passing a small amount of thick, bloody mucus from your vagina. This is called normal bloody show or losing your mucus plug. This may happen more than a week before labor begins, or right before labor begins, as the opening of the cervix starts to widen (dilate). For some women, the entire mucus plug passes at once. For others, pieces of the mucus plug may gradually pass over several days.  Signs  and symptoms that labor has begun Signs that you are in labor may include: Having contractions that come at regular (evenly spaced) intervals and increase in intensity. This may feel like more intense tightening or pressure in your abdomen that moves to your back. Feeling pressure in the vaginal area. Your water breaking (called rupture of membranes). This is when the sac of fluid that surrounds your baby breaks. Fluid leaking from your vagina may be clear or blood-tinged. Labor usually starts within 24 hours of your water breaking, but it may take longer to begin. Some people may feel a sudden gush of fluid; others may notice repeatedly damp underwear.  Go to the hospital when  Your water breaks. Your labor has started: painful, regular contractions that are 5 minutes apart You have a fever. You have bright red blood coming from your vagina. You do not feel your baby moving. You have a severe headache with or without vision problems. You have chest pain or shortness of breath. These symptoms may represent a serious problem that is an emergency. Do not wait to see if the symptoms will go away. Get medical help right away. Call your local emergency services (911 in the [place] Do not drive yourself to the hospital.  Summary Labor is your body's natural process of moving your baby and the placenta out of your uterus. The process of labor usually starts when your baby is full-term When labor starts, or if your water breaks, call your health care provider or nurse care line. Based on your situation, they will determine when you should go in for an exam. This information is not intended to replace advice given to you by your health care provider. Make sure you discuss any questions you have with your health care provider.  Document Revised: 02/13/2021 Document Reviewed: 02/13/2021-- adapted Elsevier Patient Education  2024 [employer].

## 2024-09-22 NOTE — Assessment & Plan Note (Addendum)
 LR NIPS. Orders:   aspirin EC 81 MG tablet; Take 1 tablet (81 mg total) by mouth daily. Take after 12 weeks for prevention of preeclampsia later in pregnancy

## 2024-09-22 NOTE — Progress Notes (Addendum)
 PRENATAL VISIT NOTE  Subjective:  Melanie Castillo is a 43 y.o. H4E5995 at [redacted]w[redacted]d being seen today for ongoing prenatal care. She is currently monitored for the following issues for this high-risk pregnancy  Patient Active Problem List   Diagnosis Date Noted   Alpha thalassemia silent carrier 09/22/2024   Family history of Down syndrome 09/02/2024   Supervision of high risk pregnancy, antepartum 09/02/2024   History of gestational diabetes in prior pregnancy, currently pregnant 08/30/2024   GAD (generalized anxiety disorder) 04/22/2019   AMA (advanced maternal age) multigravida 35+ 11/27/2016   Maternal obesity affecting pregnancy, antepartum 10/17/2016    Patient reports .   Contractions: Not present Vag. Bleeding: None Movement: Present Denies leaking of fluid.   Declines flu today, will consider Tdap. Considering BTL, does not desire another pregnancy.     The following portions of the patient's history were reviewed and updated as appropriate: allergies, current medications, past family history, past medical history, past social history, past surgical history and problem list.   Objective:   Vitals:   09/22/24 1327  BP: 126/83  Pulse: 97  Weight: 270 lb (122.5 kg)    Fetal Status: Fetal Heart Rate (bpm): 141 Fundal Height: 29 cm Movement: Present     General:  Alert, oriented and cooperative. Patient is in no acute distress.  Skin: Skin is warm and dry. No rash noted.   Cardiovascular: Normal heart rate noted  Respiratory: Normal respiratory effort, no problems with respiration noted  Abdomen: Soft, gravid, 29 cm appropriate for gestational age.  Pain/Pressure: Absent     Pelvic: Cervical exam deferred        Extremities: Normal range of motion.  Edema: None  Mental Status: Normal mood and affect. Normal behavior. Normal judgment and thought content.   Assessment and Plan:  KATRICE GOEL is 43 y.o. H4E5995 at [redacted]w[redacted]d, 12/09/2024, by Ultrasound Assessment &  Plan Supervision of high risk pregnancy, antepartum BP, FHT, and FH appropriate. Candidiate for ASA81, called pt after appt to discuss indication. She is agreeable, rx sent. Updated to high risk preg in light of new gDM dx. Late to care, anatomy scan is scheduled.     Multigravida of advanced maternal age in third trimester LR NIPS. Orders:   aspirin EC 81 MG tablet; Take 1 tablet (81 mg total) by mouth daily. Take after 12 weeks for prevention of preeclampsia later in pregnancy  Gestational diabetes mellitus (GDM) in third trimester, gestational diabetes method of control unspecified H/o gDM. Failed 2h GTT. Supplies sent today, given glucose log, and scheduled with DM educator. Discussed importance of glucose management in fetal development.  Orders:   Blood Glucose Monitoring Suppl (ACCU-CHEK GUIDE) w/Device KIT; 1 kit by Does not apply route in the morning, at noon, in the evening, and at bedtime.   Accu-Chek Softclix Lancets lancets; Check glucose four times a day   glucose blood test strip; Check blood glucose four times a day   aspirin EC 81 MG tablet; Take 1 tablet (81 mg total) by mouth daily. Take after 12 weeks for prevention of preeclampsia later in pregnancy  Alpha thalassemia silent carrier No FOB testing.     Pregnant with plans to adopt out baby, third trimester Certain about choosing adoption; in contact with Amelia at [employer] Consider cabergoline to inhibit lactation after delivery.     Unwanted fertility Discussed R/B/A today and surgical methods. She is sure she does not desire another pregnancy. Consent NOT signed today,  she would like to think about the surgery.        Preterm labor symptoms and general obstetric precautions including but not limited to vaginal bleeding, contractions, leaking of fluid and fetal movement were reviewed in detail with the patient. Please refer to After Visit Summary for other counseling recommendations.    Future Appointments  Date Time Provider Department Center  10/04/2024  2:35 PM Nicholaus Burnard HERO, MD [facility]  10/05/2024 10:15 AM [facility]  10/27/2024  8:00 AM [facility] PROVIDER 1 [facility]  10/27/2024  8:30 AM [facility] US1 [facility]     Barabara Maier, DO [facility] for [employer]

## 2024-09-23 ENCOUNTER — Ambulatory Visit: Payer: Self-pay | Admitting: Student

## 2024-09-27 ENCOUNTER — Other Ambulatory Visit: Payer: Self-pay

## 2024-09-27 ENCOUNTER — Telehealth: Payer: Self-pay

## 2024-09-27 DIAGNOSIS — O24419 Gestational diabetes mellitus in pregnancy, unspecified control: Secondary | ICD-10-CM

## 2024-09-27 NOTE — Telephone Encounter (Signed)
 Patient verified name and DOB. CMA spoke to patient to inform her FMLA form is completed and she can come by the office to pick up the original form.  Patient understood.  Melanie Castillo CMA

## 2024-10-04 ENCOUNTER — Ambulatory Visit: Admitting: Obstetrics and Gynecology

## 2024-10-04 ENCOUNTER — Other Ambulatory Visit: Payer: Self-pay

## 2024-10-04 ENCOUNTER — Encounter: Payer: Self-pay | Admitting: Obstetrics and Gynecology

## 2024-10-04 VITALS — BP 127/77 | HR 103 | Wt 271.7 lb

## 2024-10-04 DIAGNOSIS — Z8632 Personal history of gestational diabetes: Secondary | ICD-10-CM

## 2024-10-04 DIAGNOSIS — O99210 Obesity complicating pregnancy, unspecified trimester: Secondary | ICD-10-CM

## 2024-10-04 DIAGNOSIS — O09293 Supervision of pregnancy with other poor reproductive or obstetric history, third trimester: Secondary | ICD-10-CM | POA: Diagnosis not present

## 2024-10-04 DIAGNOSIS — F411 Generalized anxiety disorder: Secondary | ICD-10-CM

## 2024-10-04 DIAGNOSIS — O09523 Supervision of elderly multigravida, third trimester: Secondary | ICD-10-CM

## 2024-10-04 DIAGNOSIS — O09299 Supervision of pregnancy with other poor reproductive or obstetric history, unspecified trimester: Secondary | ICD-10-CM

## 2024-10-04 DIAGNOSIS — Z8279 Family history of other congenital malformations, deformations and chromosomal abnormalities: Secondary | ICD-10-CM | POA: Diagnosis not present

## 2024-10-04 DIAGNOSIS — D563 Thalassemia minor: Secondary | ICD-10-CM | POA: Diagnosis not present

## 2024-10-04 DIAGNOSIS — O99213 Obesity complicating pregnancy, third trimester: Secondary | ICD-10-CM

## 2024-10-04 DIAGNOSIS — Z3A30 30 weeks gestation of pregnancy: Secondary | ICD-10-CM | POA: Diagnosis not present

## 2024-10-04 DIAGNOSIS — O0990 Supervision of high risk pregnancy, unspecified, unspecified trimester: Secondary | ICD-10-CM

## 2024-10-04 DIAGNOSIS — O0993 Supervision of high risk pregnancy, unspecified, third trimester: Secondary | ICD-10-CM | POA: Diagnosis not present

## 2024-10-04 NOTE — Progress Notes (Signed)
 "  PRENATAL VISIT NOTE  Subjective:  Melanie Castillo is a 43 y.o. H4E5995 at [redacted]w[redacted]d being seen today for ongoing prenatal care.  She is currently monitored for the following issues for this high-risk pregnancy and has AMA (advanced maternal age) multigravida 35+; Maternal obesity affecting pregnancy, antepartum; GAD (generalized anxiety disorder); History of gestational diabetes in prior pregnancy, currently pregnant; Family history of Down syndrome; Supervision of high risk pregnancy, antepartum; and Alpha thalassemia silent carrier on their problem list.  Patient reports worsening carpal tunnel symptoms and being tired.  Contractions: Not present. Vag. Bleeding: None.  Movement: Present. Denies leaking of fluid.   The following portions of the patient's history were reviewed and updated as appropriate: allergies, current medications, past family history, past medical history, past social history, past surgical history and problem list.   Objective:   Vitals:   10/04/24 1441  BP: 127/77  Pulse: (!) 103  Weight: 271 lb 11.2 oz (123.2 kg)    Fetal Status:  Fetal Heart Rate (bpm): 149   Movement: Present    General: Alert, oriented and cooperative. Patient is in no acute distress.  Skin: Skin is warm and dry. No rash noted.   Cardiovascular: Normal heart rate noted  Respiratory: Normal respiratory effort, no problems with respiration noted  Abdomen: Soft, gravid, appropriate for gestational age.  Pain/Pressure: Present     Pelvic: Cervical exam deferred        Extremities: Normal range of motion.  Edema: None  Mental Status: Normal mood and affect. Normal behavior. Normal judgment and thought content.      09/08/2024    2:22 PM 03/05/2017    3:51 PM  Depression screen PHQ 2/9  Decreased Interest 2 0  Down, Depressed, Hopeless 2 0  PHQ - 2 Score 4 0  Altered sleeping 2   Tired, decreased energy 2   Change in appetite 0   Feeling bad or failure about yourself  2   Trouble  concentrating 0   Moving slowly or fidgety/restless 0   Suicidal thoughts 0   PHQ-9 Score 10         09/08/2024    2:22 PM  GAD 7 : Generalized Anxiety Score  Nervous, Anxious, on Edge 0  Control/stop worrying 2  Worry too much - different things 2  Trouble relaxing 2  Restless 0  Easily annoyed or irritable 2  Afraid - awful might happen 0  Total GAD 7 Score 8    Assessment and Plan:  Pregnancy: G5P4004 at [redacted]w[redacted]d  1. Obesity affecting pregnancy, antepartum, unspecified obesity type (Primary)  2. Multigravida of advanced maternal age in third trimester  3. History of gestational diabetes in prior pregnancy, currently pregnant Fasting are 102 Pp 120 Patient has appt with diabetic educator tomorrow Reviewed would need medication for fastings, gold standard is insulin, but as she has only been checking CBGs x 1 week and has appt with diabetic educator tomorrow, will see if she can improve her diet and then reassess need for insulin in 2 weeks, she is agreeable to this plan  4. Family history of Down syndrome  5. Supervision of high risk pregnancy, antepartum Reviewed options for contraception, including BTL, patient not sure about wanting permanent sterilization, reviewed PPTL vs lap BTL and depending on weight and habitus after delivery, may not be candidate for PPTL, she verbalizes understanding and will consider  6. Alpha thalassemia silent carrier  7. GAD (generalized anxiety disorder)  8. [redacted] weeks gestation of  pregnancy   Preterm labor symptoms and general obstetric precautions including but not limited to vaginal bleeding, contractions, leaking of fluid and fetal movement were reviewed in detail with the patient. Please refer to After Visit Summary for other counseling recommendations.   Return in about 2 weeks (around 10/18/2024) for high OB.  Future Appointments  Date Time Provider Department Center  10/05/2024 10:15 AM [facility]  10/27/2024  8:00  AM [facility] PROVIDER 1 [facility]  10/27/2024  8:30 AM [facility] US1 [facility]    Burnard CHRISTELLA Moats, MD   "

## 2024-10-05 ENCOUNTER — Ambulatory Visit (INDEPENDENT_AMBULATORY_CARE_PROVIDER_SITE_OTHER): Admitting: Dietician

## 2024-10-05 ENCOUNTER — Encounter: Attending: Student | Admitting: Dietician

## 2024-10-05 DIAGNOSIS — O24419 Gestational diabetes mellitus in pregnancy, unspecified control: Secondary | ICD-10-CM | POA: Diagnosis not present

## 2024-10-05 DIAGNOSIS — Z3A30 30 weeks gestation of pregnancy: Secondary | ICD-10-CM | POA: Diagnosis not present

## 2024-10-05 DIAGNOSIS — Z713 Dietary counseling and surveillance: Secondary | ICD-10-CM | POA: Diagnosis present

## 2024-10-05 NOTE — Progress Notes (Signed)
 Patient was seen for Gestational Diabetes on 10/05/2024  Start time 0920 and End time 0950   Estimated due date:  [redacted]w[redacted]d  Clinical: Medications: aspirin , prenatal vitamin, bupropion, xanax  prn Medical History: GDM currently and previously Labs: OGTT fasting 102, 1 hour 194, 2 hour 164 on 09/20/2024, A1c 5.8% on 09/08/2024  Dietary and Lifestyle History: Patient lives with her 4 children.  She does the shopping and cooking.  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  She has a son with GI issues and another with Downs syndrome, daughter that doesn't like to eat and a 75 yo.   She is giving this child up for adoption.  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 First Meal:  baked chicken Snack:  none Second meal:  5 mozzarella sticks Snack:  none Third meal:  baked chicken, small portion rice Snack:  none Beverages:  water  NUTRITION INTERVENTION  Nutrition education (E-1) on the following topics:   Initial Follow-up  [x]  []  Definition of Gestational Diabetes [x]  []  Why dietary management is important in controlling blood glucose Poorly controlled diabetes can lead to fetal macrosomia, shoulder dystocia and neurological injuries, stillbirth and neonatal complications including respiratory distress syndrome, hypoglycemia and prolonged NICU admission.  [x]  []  Effects each nutrient has on blood glucose levels [x]  []  Simple carbohydrates vs complex carbohydrates [x]  []  Fluid intake [x]  []  Creating a balanced meal plan [x]  []  Carbohydrate counting  [x]  []  When to check blood glucose levels [x]  []  Proper blood glucose monitoring techniques [x]  []  Effect of stress and stress reduction techniques  [x]  []  Exercise effect on blood glucose levels, appropriate exercise during pregnancy []  []  Importance of limiting caffeine and abstaining from alcohol and  smoking [x]  []  Medications used for blood sugar control during pregnancy [x]  []  Hypoglycemia and rule of 15 [x]  []  Postpartum self care   Patient has a meter prior to visit. Patient is testing pre breakfast and 2 hours after each meal. FBS: 102-105 Postprandial: <135  Patient instructed to monitor glucose levels: FBS: 60 - <= 95 mg/dL; 2 hour: <= 879 mg/dL  Patient received handouts: Nutrition Diabetes and Pregnancy Carbohydrate Counting List Blood glucose log Snack ideas for diabetes during pregnancy  Patient will be seen for follow-up as needed.

## 2024-10-13 DIAGNOSIS — Z3490 Encounter for supervision of normal pregnancy, unspecified, unspecified trimester: Secondary | ICD-10-CM | POA: Insufficient documentation

## 2024-10-13 DIAGNOSIS — O24410 Gestational diabetes mellitus in pregnancy, diet controlled: Secondary | ICD-10-CM | POA: Insufficient documentation

## 2024-10-22 ENCOUNTER — Encounter: Payer: Self-pay | Admitting: Obstetrics & Gynecology

## 2024-10-22 ENCOUNTER — Other Ambulatory Visit: Payer: Self-pay

## 2024-10-22 ENCOUNTER — Ambulatory Visit: Payer: Self-pay | Admitting: Obstetrics & Gynecology

## 2024-10-22 VITALS — BP 125/74 | HR 95 | Wt 266.0 lb

## 2024-10-22 DIAGNOSIS — O99213 Obesity complicating pregnancy, third trimester: Secondary | ICD-10-CM | POA: Diagnosis not present

## 2024-10-22 DIAGNOSIS — O24419 Gestational diabetes mellitus in pregnancy, unspecified control: Secondary | ICD-10-CM | POA: Diagnosis not present

## 2024-10-22 DIAGNOSIS — Z3A33 33 weeks gestation of pregnancy: Secondary | ICD-10-CM

## 2024-10-22 DIAGNOSIS — O0990 Supervision of high risk pregnancy, unspecified, unspecified trimester: Secondary | ICD-10-CM

## 2024-10-22 DIAGNOSIS — O0993 Supervision of high risk pregnancy, unspecified, third trimester: Secondary | ICD-10-CM

## 2024-10-22 MED ORDER — METFORMIN HCL ER 500 MG PO TB24: 500.0000 mg | ORAL_TABLET | Freq: Every day | ORAL | 1 refills | Status: AC

## 2024-10-22 NOTE — Progress Notes (Signed)
 "  PRENATAL VISIT NOTE  Subjective:  Melanie Castillo is a 44 y.o. H4E5995 at [redacted]w[redacted]d being seen today for ongoing prenatal care.  She is currently monitored for the following issues for this high-risk pregnancy and has AMA (advanced maternal age) multigravida 35+; Maternal morbid obesity, antepartum (HCC); GAD (generalized anxiety disorder); Family history of Down syndrome; Supervision of high risk pregnancy, antepartum; Alpha thalassemia silent carrier; Diet controlled gestational diabetes mellitus (GDM), antepartum; and Pregnancy with adoption planned, antepartum on their problem list.  Patient reports no complaints.  Contractions: Irritability. Vag. Bleeding: None.  Movement: Present. Denies leaking of fluid.   The following portions of the patient's history were reviewed and updated as appropriate: allergies, current medications, past family history, past medical history, past social history, past surgical history and problem list.   Objective:   Vitals:   10/22/24 1035  BP: 125/74  Pulse: 95  Weight: 266 lb (120.7 kg)    Fetal Status:  Fetal Heart Rate (bpm): 137   Movement: Present    General: Alert, oriented and cooperative. Patient is in no acute distress.  Skin: Skin is warm and dry. No rash noted.   Cardiovascular: Normal heart rate noted  Respiratory: Normal respiratory effort, no problems with respiration noted  Abdomen: Soft, gravid, appropriate for gestational age.  Pain/Pressure: Present     Pelvic: Cervical exam deferred        Extremities: Normal range of motion.  Edema: Trace  Mental Status: Normal mood and affect. Normal behavior. Normal judgment and thought content.      09/08/2024    2:22 PM 03/05/2017    3:51 PM  Depression screen PHQ 2/9  Decreased Interest 2 0  Down, Depressed, Hopeless 2 0  PHQ - 2 Score 4 0  Altered sleeping 2   Tired, decreased energy 2   Change in appetite 0   Feeling bad or failure about yourself  2   Trouble concentrating 0    Moving slowly or fidgety/restless 0   Suicidal thoughts 0   PHQ-9 Score 10         09/08/2024    2:22 PM  GAD 7 : Generalized Anxiety Score  Nervous, Anxious, on Edge 0  Control/stop worrying 2  Worry too much - different things 2  Trouble relaxing 2  Restless 0  Easily annoyed or irritable 2  Afraid - awful might happen 0  Total GAD 7 Score 8    Assessment and Plan:  Pregnancy: G5P4004 at [redacted]w[redacted]d 1. Gestational diabetes mellitus (GDM), antepartum, gestational diabetes method of control unspecified Fastings in 100s, postprandials 100-120s. Discussed ned for medication, patient refuses to be on insulin. Offered Metformin , she said she will try this but will discontinue it if she has bothersome side effects. Emphasized importance of  good glycemic control in pregnancy. If adequately controlled, IOL will be recommended around 37 weeks, patient is fine with this. She is already scheduled to start weekly antenatal testing next week due to BMI >40. - metFORMIN  (GLUCOPHAGE -XR) 500 MG 24 hr tablet; Take 1 tablet (500 mg total) by mouth at bedtime.  Dispense: 30 tablet; Refill: 1  2. Maternal morbid obesity, antepartum (HCC) Growth scan/anatomy scan next week, will follow up results and manage accordingly. BPP will be added on to this study, then will do weekly BPPs here in office until delivery. - US  MFM FETAL BPP WO NON STRESS; Future - US  Fetal BPP W/O Non Stress; Standing  3. [redacted] weeks gestation of pregnancy 4. Supervision  of high risk pregnancy, antepartum (Primary) Gave information about RSV vaccine.  No other complaints. Preterm labor symptoms and general obstetric precautions including but not limited to vaginal bleeding, contractions, leaking of fluid and fetal movement were reviewed in detail with the patient. Please refer to After Visit Summary for other counseling recommendations.   Return in about 2 weeks (around 11/05/2024) for BPP, OFFICE OB VISIT (MD only).  Future  Appointments  Date Time Provider Department Center  10/27/2024  8:00 AM [facility] PROVIDER 1 [facility]  10/27/2024  8:30 AM [facility] US1 [facility]    [name], MD  "

## 2024-10-22 NOTE — Patient Instructions (Signed)
 RSV Vaccination for Pregnant People  CDC recommends two ways to protect babies from getting very sick with Respiratory Syncytial Virus (RSV):  An RSV vaccination given during pregnancy  Pfizer's vaccine Verdis Frederickson) is recommended for use during pregnancy. It is given during RSV season to people who are 32 through [redacted] weeks pregnant.  Or, An RSV immunization given directly to infants and some older babies  Babies born to mothers who get RSV vaccine at least 2 weeks before delivery will have protection and, in most cases, should not need an RSV immunization later.    When is RSV season?  In most regions of the [place] RSV season starts in the fall and peaks in the winter, but the timing and severity of RSV season can vary from place to place and year to year.   The goal of maternal RSV vaccination is to protect babies from getting very sick with RSV during their first RSV season.  In most of the [place], this means maternal RSV vaccine will be given in September through January.  Who should get the maternal RSV vaccine?  People who are 85 through [redacted] weeks pregnant during September through January should get one dose of maternal RSV vaccine to protect their babies. RSV season can vary around the country.   How is the maternal RSV vaccine administered?  Maternal RSV vaccine is given as a shot into the mother's upper arm. Only a single dose (one shot) of maternal RSV vaccine is recommended.   It is not yet known whether another dose might be needed in later pregnancies.  How well does the maternal RSV vaccine work?  When someone gets RSV vaccine, their body responds by making a protein that protects against the virus that causes RSV. The process takes about 2 weeks. When a pregnant person gets RSV vaccine, their protective proteins (called antibodies) also pass to their baby. So, babies who are born at least 2 weeks after their mother gets RSV vaccine are protected  at birth, when infants are at the highest risk of severe RSV disease.   The vaccine can reduce a baby's risk of being hospitalized from RSV by 57% in the first six months after birth.  What are the possible side effects of the maternal RSV vaccine?  In the clinical trials, the side effects most often reported by pregnant people who received the maternal RSV vaccine were pain at the injection site, headache, muscle pain, and nausea.  Although not common, a dangerous high blood pressure condition called pre-eclampsia occurred in 1.8% of pregnant people who received the maternal RSV vaccine compared to 1.4% of pregnant people who received a placebo.  The clinical trials identified a small increase in the number of preterm births in vaccinated pregnant people. It is not clear if this is a true safety problem related to RSV vaccine or if this occurred for reasons unrelated to vaccination.  To reduce the potential risk of preterm birth and complications from RSV disease, FDA approved the maternal RSV vaccine for use during weeks 32 through 84 of pregnancy while additional studies are conducted.  FDA is requiring the manufacturer to do additional studies that will look more closely at the potential risk of preterm births and pregnancy-related high blood pressure issues in mothers, including pre-eclampsia.  Severe allergic reactions to vaccines are rare but can happen after any vaccine and can be life-threatening. If you see signs of a severe allergic reaction after vaccination (hives, swelling of the face  and throat, difficulty breathing, a fast heartbeat, dizziness, or weakness), seek immediate medical care by calling 911.  As with any medicine or vaccine there is a very remote chance of the vaccine causing other serious injury or death after vaccination.  Adverse events following vaccination should be reported to the Vaccine Adverse Event Reporting System (VAERS), even if it's not clear that the vaccine  caused the adverse event. You or your doctor can report an adverse event to [facility] and FDA through VAERS. If you need further assistance reporting to VAERS, please email [email or web address] [email or web address] or call 5067167565.  If you have any questions about side effects from the maternal RSV vaccine, talk with your healthcare provider.  Do I need a prescription for a maternal RSV vaccine?  Until the vaccine available in the office, you will need a prescription to take to a local pharmacy that is providing the vaccine.   How do I pay for the maternal RSV vaccine?  Most private health insurance plans cover the maternal RSV vaccine, but there may be a cost to you depending on your plan.  Contact your insurer to find out.  Medicaid Beginning July 14, 2022, most people with coverage from [facility] and [employer] [facility]) will be guaranteed coverage of all vaccines recommended by the Advisory Committee on Immunization Practice at no cost to them.   Source: [facility] for Immunization and Respiratory Diseases

## 2024-10-25 ENCOUNTER — Encounter: Payer: Self-pay | Admitting: *Deleted

## 2024-10-27 ENCOUNTER — Other Ambulatory Visit

## 2024-10-27 ENCOUNTER — Telehealth: Payer: Self-pay | Admitting: Lactation Services

## 2024-10-27 ENCOUNTER — Ambulatory Visit: Attending: Maternal & Fetal Medicine | Admitting: Maternal & Fetal Medicine

## 2024-10-27 VITALS — BP 123/72 | HR 95

## 2024-10-27 DIAGNOSIS — O09299 Supervision of pregnancy with other poor reproductive or obstetric history, unspecified trimester: Secondary | ICD-10-CM

## 2024-10-27 DIAGNOSIS — O99213 Obesity complicating pregnancy, third trimester: Secondary | ICD-10-CM

## 2024-10-27 DIAGNOSIS — O09523 Supervision of elderly multigravida, third trimester: Secondary | ICD-10-CM

## 2024-10-27 DIAGNOSIS — D563 Thalassemia minor: Secondary | ICD-10-CM

## 2024-10-27 DIAGNOSIS — Z8279 Family history of other congenital malformations, deformations and chromosomal abnormalities: Secondary | ICD-10-CM | POA: Diagnosis not present

## 2024-10-27 DIAGNOSIS — Z148 Genetic carrier of other disease: Secondary | ICD-10-CM | POA: Diagnosis not present

## 2024-10-27 DIAGNOSIS — Z3A34 34 weeks gestation of pregnancy: Secondary | ICD-10-CM | POA: Diagnosis not present

## 2024-10-27 DIAGNOSIS — O09293 Supervision of pregnancy with other poor reproductive or obstetric history, third trimester: Secondary | ICD-10-CM

## 2024-10-27 DIAGNOSIS — Z3A33 33 weeks gestation of pregnancy: Secondary | ICD-10-CM

## 2024-10-27 DIAGNOSIS — O24419 Gestational diabetes mellitus in pregnancy, unspecified control: Secondary | ICD-10-CM | POA: Diagnosis not present

## 2024-10-27 DIAGNOSIS — O99013 Anemia complicating pregnancy, third trimester: Secondary | ICD-10-CM | POA: Diagnosis not present

## 2024-10-27 DIAGNOSIS — Z363 Encounter for antenatal screening for malformations: Secondary | ICD-10-CM | POA: Insufficient documentation

## 2024-10-27 DIAGNOSIS — Z3490 Encounter for supervision of normal pregnancy, unspecified, unspecified trimester: Secondary | ICD-10-CM

## 2024-10-27 DIAGNOSIS — E669 Obesity, unspecified: Secondary | ICD-10-CM | POA: Diagnosis not present

## 2024-10-27 DIAGNOSIS — O24410 Gestational diabetes mellitus in pregnancy, diet controlled: Secondary | ICD-10-CM | POA: Insufficient documentation

## 2024-10-27 DIAGNOSIS — O0990 Supervision of high risk pregnancy, unspecified, unspecified trimester: Secondary | ICD-10-CM

## 2024-10-27 DIAGNOSIS — Z8632 Personal history of gestational diabetes: Secondary | ICD-10-CM | POA: Diagnosis present

## 2024-10-27 NOTE — Progress Notes (Signed)
 "  Patient information  Patient Name: Melanie Castillo  Patient MRN:   [id number]  Referring practice: MFM Referring Provider: [facility])  Problem List   Patient Active Problem List   Diagnosis Date Noted   Diet controlled gestational diabetes mellitus (GDM), antepartum 10/13/2024   Pregnancy with adoption planned, antepartum 10/13/2024   Alpha thalassemia silent carrier 09/22/2024   Family history of Down syndrome 09/02/2024   Supervision of high risk pregnancy, antepartum 09/02/2024   GAD (generalized anxiety disorder) 04/22/2019   AMA (advanced maternal age) multigravida 35+ 11/27/2016   Maternal morbid obesity, antepartum (HCC) 10/17/2016    Maternal Fetal Medicine Consult Melanie Castillo is a 44 y.o. H4E5995 at [redacted]w[redacted]d here for ultrasound and consultation. She had low risk aneuploidy screening of a female fetus. Carrier screening was positive for alpha thalassemia silent carrier (a-/aa). Maternal serum AFP n/a. She has no acute concerns.   Today we focused on the following:   The pregnancy is complicated by obesity, advanced maternal age at 43 years, and gestational diabetes that is currently diet controlled. We discussed the clinical significance of each of these conditions, including the importance of maintaining appropriate glycemic control and the need for ongoing fetal surveillance throughout the remainder of the pregnancy. Today most of her blood sugars are diet controlled and she is motivated to avoid having to take medication. Diabetic diet discussed.   I discussed that the patient is a silent carrier for alpha thalassemia and reviewed that the only way to further assess fetal risk during pregnancy would be to test the father of the baby; however, she has declined paternal testing at this time and prefers to defer evaluation until after delivery. We also reviewed that pregnancies complicated by advanced maternal age over 40 years are associated  with increased risks, including fetal growth restriction, stillbirth, abnormal fetal growth, and cesarean delivery, with overall risk being proportionate to the patients underlying health status. The patient reports that she has been losing weight and feels well. I counseled that intentional weight loss is not a goal during pregnancy; however, unintentional weight loss that occurs as a result of a healthy diet and lifestyle is acceptable, with emphasis on adequate nutrition and appropriate gestational weight gain.  On todays ultrasound, the estimated fetal weight is at the 73rd percentile for gestational age. The biophysical profile is reassuring at 8/8. She will continue close monitoring for the remainder of the pregnancy.  RECOMMENDATIONS -Continue weekly nonstress testing. -Maintain a pregnancy-appropriate diabetic diet with ongoing glycemic monitoring. If >3/4 glucose values per day are at goal then medication is not indicated. -Repeat growth ultrasounds every four weeks. -Continue routine prenatal care with close monitoring given obesity, advanced maternal age, and gestational diabetes. -Defer paternal carrier testing per patient preference. -Plan for neonatal testing after birth as indicated.  65 minutes of time was spent reviewing the patient's chart including labs, imaging and documentation.  At least 50% of this time was spent with direct patient care discussing the diagnosis, management and prognosis of her care.  Review of Systems: A review of systems was performed and was negative except per HPI   Past Obstetrical History:  OB History  Gravida Para Term Preterm AB Living  5 4 4  0 0 4  SAB IAB Ectopic Multiple Live Births  0 0 0 0 4    # Outcome Date GA Lbr Len/2nd Weight Sex Type Anes PTL Lv  5 Current  4 Term 04/13/17 [redacted]w[redacted]d 07:54 / 00:11 7 lb 13 oz (3.544 kg) F Vag-Spont EPI  LIV  3 Term 05/18/09 [redacted]w[redacted]d   M Vag-Spont None  LIV  2 Term 07/05/08 [redacted]w[redacted]d   F Vag-Spont EPI   LIV  1 Term 03/16/95 [redacted]w[redacted]d   M Vag-Spont EPI N LIV     Past Medical History:  Past Medical History:  Diagnosis Date   Anxiety    Cancer (HCC)    Class 3 severe obesity without serious comorbidity with body mass index (BMI) of 40.0 to 44.9 in adult [facility]) 06/07/2022   Family history of colon cancer in father 06/07/2022   Early 50's diagnosis     Gestational diabetes    glyburide    Hypertension    Pregnancy 2018   Maternal age 61+, multigravida, antepartum 10/17/2016   44 years old-declines all genetic testing. Oldest son 45 yo has downs syndrome     Medical history non-contributory    Pain in right knee 12/23/2017   Pain in right knee 12/23/2017   Pregnancy 08/27/2016   Vertigo      Past Surgical History:    Past Surgical History:  Procedure Laterality Date   NO PAST SURGERIES       Home Medications:   Medications Ordered Prior to Encounter[1]    Allergies:   Allergies[2]   Physical Exam:   Vitals:   10/27/24 0808  BP: 123/72  Pulse: 95   Sitting comfortably on the sonogram table Nonlabored breathing Normal rate and rhythm Abdomen is nontender  Thank you for the opportunity to be involved with this patient's care. Please let us  know if we can be of any further assistance.   Delora Smaller MFM, [place]   10/27/2024  9:58 AM      [1]  Current Outpatient Medications on File Prior to Visit  Medication Sig Dispense Refill   Accu-Chek Softclix Lancets lancets Check glucose four times a day 100 each 12   ALPRAZolam  (XANAX ) 0.25 MG tablet Take 1 tablet (0.25 mg total) by mouth 3 (three) times daily as needed for anxiety. 10 tablet 0   aspirin  EC 81 MG tablet Take 1 tablet (81 mg total) by mouth daily. Take after 12 weeks for prevention of preeclampsia later in pregnancy 90 tablet 0   Blood Glucose Monitoring Suppl (ACCU-CHEK GUIDE) w/Device KIT 1 kit by Does not apply route in the morning, at noon, in the evening, and at bedtime. 1 kit 0   glucose blood test  strip Check blood glucose four times a day 100 each 12   Prenatal Vit-Fe Fumarate-FA (TRINATE) TABS Take 1 tablet by mouth daily. 28 tablet 11   metFORMIN  (GLUCOPHAGE -XR) 500 MG 24 hr tablet Take 1 tablet (500 mg total) by mouth at bedtime. 30 tablet 1   No current facility-administered medications on file prior to visit.  [2]  Allergies Allergen Reactions   Wasp Venom Anaphylaxis   "

## 2024-10-27 NOTE — Telephone Encounter (Signed)
 Received refill request for 90 day supply of Metformin . Spoke with Dr. Jeralyn who reports patient should not need more than 60 days as should be delivering around [redacted] weeks gestation.   Called pharmacy to cancel 90 day supply request. Waited online for over 15 minutes with no answer. Faxed form back to [facility]. Fax confirmation received.

## 2024-11-02 ENCOUNTER — Other Ambulatory Visit: Payer: Self-pay

## 2024-11-02 ENCOUNTER — Other Ambulatory Visit (HOSPITAL_COMMUNITY)
Admission: RE | Admit: 2024-11-02 | Discharge: 2024-11-02 | Disposition: A | Source: Ambulatory Visit | Attending: Obstetrics and Gynecology | Admitting: Obstetrics and Gynecology

## 2024-11-02 ENCOUNTER — Encounter: Payer: Self-pay | Admitting: Obstetrics and Gynecology

## 2024-11-02 ENCOUNTER — Ambulatory Visit (INDEPENDENT_AMBULATORY_CARE_PROVIDER_SITE_OTHER): Payer: Self-pay | Admitting: Obstetrics and Gynecology

## 2024-11-02 ENCOUNTER — Telehealth: Payer: Self-pay | Admitting: Family Medicine

## 2024-11-02 ENCOUNTER — Other Ambulatory Visit

## 2024-11-02 VITALS — BP 137/89 | HR 96 | Wt 267.0 lb

## 2024-11-02 DIAGNOSIS — Z23 Encounter for immunization: Secondary | ICD-10-CM | POA: Diagnosis not present

## 2024-11-02 DIAGNOSIS — Z3490 Encounter for supervision of normal pregnancy, unspecified, unspecified trimester: Secondary | ICD-10-CM

## 2024-11-02 DIAGNOSIS — Z3A35 35 weeks gestation of pregnancy: Secondary | ICD-10-CM

## 2024-11-02 DIAGNOSIS — O0990 Supervision of high risk pregnancy, unspecified, unspecified trimester: Secondary | ICD-10-CM | POA: Diagnosis present

## 2024-11-02 DIAGNOSIS — O99213 Obesity complicating pregnancy, third trimester: Secondary | ICD-10-CM

## 2024-11-02 DIAGNOSIS — Z3A34 34 weeks gestation of pregnancy: Secondary | ICD-10-CM

## 2024-11-02 DIAGNOSIS — O09523 Supervision of elderly multigravida, third trimester: Secondary | ICD-10-CM

## 2024-11-02 DIAGNOSIS — O24410 Gestational diabetes mellitus in pregnancy, diet controlled: Secondary | ICD-10-CM

## 2024-11-02 DIAGNOSIS — O0993 Supervision of high risk pregnancy, unspecified, third trimester: Secondary | ICD-10-CM | POA: Diagnosis not present

## 2024-11-02 NOTE — Telephone Encounter (Signed)
 Patient was seen by Dr.Duncan today. She said her due date was changed to 11/18/24 and she needs this updated on her FMLA paperwork that we have already completed.

## 2024-11-02 NOTE — Progress Notes (Signed)
 "  PRENATAL VISIT NOTE  Subjective:  Melanie Castillo is a 44 y.o. H4E5995 at [redacted]w[redacted]d being seen today for ongoing prenatal care.  She is currently monitored for the following issues for this high-risk pregnancy and has AMA (advanced maternal age) multigravida 35+; Maternal morbid obesity, antepartum (HCC); GAD (generalized anxiety disorder); Family history of Down syndrome; Supervision of high risk pregnancy, antepartum; Alpha thalassemia silent carrier; Diet controlled gestational diabetes mellitus (GDM), antepartum; and Pregnancy with adoption planned, antepartum on their problem list.  Patient reports no complaints.  Contractions: Irritability. Vag. Bleeding: None.  Movement: Present. Denies leaking of fluid.   The following portions of the patient's history were reviewed and updated as appropriate: allergies, current medications, past family history, past medical history, past social history, past surgical history and problem list.   Objective:   Vitals:   11/02/24 1323  BP: 137/89  Pulse: 96  Weight: 267 lb (121.1 kg)    Fetal Status:  Fetal Heart Rate (bpm): 136   Movement: Present    General: Alert, oriented and cooperative. Patient is in no acute distress.  Skin: Skin is warm and dry. No rash noted.   Cardiovascular: Normal heart rate noted  Respiratory: Normal respiratory effort, no problems with respiration noted  Abdomen: Soft, gravid, appropriate for gestational age.  Pain/Pressure: Present     Pelvic: Cervical exam performed in the presence of a chaperone Dilation: 1 Effacement (%): Thick Station: -3  Extremities: Normal range of motion.  Edema: Mild pitting, slight indentation  Mental Status: Normal mood and affect. Normal behavior. Normal judgment and thought content.   Assessment and Plan:  Pregnancy: G5P4004 at [redacted]w[redacted]d 1. Supervision of high risk pregnancy, antepartum (Primary) Offered TDAP and RSV - pt accepts TDAP. She declines RSV.  Desires BTS. Discussed Interval  vs PPTL. She is aware of pros/cons of both (previously discussed). She would like to avoid GETA, so is hopeful for someone to try. Discussed possibility of getting one or neither tubes on attempt.  GBS/Cx done today.  Discussed option for pap today while undressed - pt accepts. Pap was [facility] at [facility] and no HPV done.   2. Diet controlled gestational diabetes mellitus (GDM), antepartum Growth normal on 1/14 with MFM - 63%ile, AC 94%ile, AFI 10.  Reviewed CBGs - she does not have log. Reports they are in the mid to high 90s, occasional 102. Meals are <120.  Started on MTF on 1/9 - pt does not want to be on insulin. She has not been taking the MTF because she reports MFM told her she does not have to take it. She also does not want to deal with the side effects of the medication.  Discussed IOL at 37w. Pt agreeable. Scheduled 2/5.   3. Pregnancy with 34 completed weeks gestation  4. Pregnancy with adoption planned, antepartum  5. Maternal morbid obesity, antepartum (HCC) Has weekly BPP scheduled.   6. Multigravida of advanced maternal age in third trimester LR F on NIPs  Preterm labor symptoms and general obstetric precautions including but not limited to vaginal bleeding, contractions, leaking of fluid and fetal movement were reviewed in detail with the patient. Please refer to After Visit Summary for other counseling recommendations.   Return in about 1 week (around 11/09/2024) for OB VISIT, MD or APP.  Future Appointments  Date Time Provider Department Center  11/09/2024  1:15 PM [facility] US2 [facility]  11/09/2024  2:35 PM Regino Camie DELENA EDDY [facility]  11/16/2024  1:15 PM  [facility] US2 [facility]  11/16/2024  2:35 PM Izell Harari, MD [facility]  11/18/2024  7:00 AM MC-LD SCHED ROOM [facility] None  11/29/2024  2:30 PM [facility] PROVIDER 1 [facility]  11/29/2024  3:00 PM [facility] US  1 [facility] [place]    Vina Solian, MD "

## 2024-11-03 ENCOUNTER — Encounter: Payer: Self-pay | Admitting: General Practice

## 2024-11-03 LAB — CERVICOVAGINAL ANCILLARY ONLY
Chlamydia: NEGATIVE
Comment: NEGATIVE
Comment: NORMAL
Neisseria Gonorrhea: NEGATIVE

## 2024-11-04 ENCOUNTER — Other Ambulatory Visit: Payer: Self-pay | Admitting: Obstetrics and Gynecology

## 2024-11-04 ENCOUNTER — Ambulatory Visit: Payer: Self-pay | Admitting: Obstetrics and Gynecology

## 2024-11-04 ENCOUNTER — Encounter: Payer: Self-pay | Admitting: Obstetrics and Gynecology

## 2024-11-04 DIAGNOSIS — O0990 Supervision of high risk pregnancy, unspecified, unspecified trimester: Secondary | ICD-10-CM

## 2024-11-04 DIAGNOSIS — O24419 Gestational diabetes mellitus in pregnancy, unspecified control: Secondary | ICD-10-CM

## 2024-11-04 DIAGNOSIS — R87619 Unspecified abnormal cytological findings in specimens from cervix uteri: Secondary | ICD-10-CM | POA: Insufficient documentation

## 2024-11-04 LAB — CYTOLOGY - PAP
Comment: NEGATIVE
Comment: NEGATIVE
Comment: NEGATIVE
Diagnosis: UNDETERMINED — AB
HPV 16: NEGATIVE
HPV 18 / 45: NEGATIVE
High risk HPV: POSITIVE — AB

## 2024-11-04 NOTE — Progress Notes (Signed)
 IOL admission orders placed. GBS unknown at time of orders. Pink sticky updated to note PCN would have to be added if GBS pos.   Vina Solian, MD Attending Obstetrician & Gynecologist, [facility] for [facility], [facility]

## 2024-11-05 NOTE — Telephone Encounter (Signed)
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 Recommend colposcopy after delivery.  Thanks, pad

## 2024-11-05 NOTE — Telephone Encounter (Signed)
 I called Melanie Castillo and gave her results and recommedations for colposcopy. I answered her questions and gave her explanation of procedure. She voices understanding. Rock Skip PEAK

## 2024-11-06 LAB — CULTURE, BETA STREP (GROUP B ONLY): Strep Gp B Culture: NEGATIVE

## 2024-11-09 ENCOUNTER — Ambulatory Visit

## 2024-11-09 ENCOUNTER — Other Ambulatory Visit: Payer: Self-pay

## 2024-11-09 ENCOUNTER — Ambulatory Visit: Payer: Self-pay | Admitting: Certified Nurse Midwife

## 2024-11-09 VITALS — BP 127/84 | HR 91 | Wt 265.2 lb

## 2024-11-09 DIAGNOSIS — Z3A35 35 weeks gestation of pregnancy: Secondary | ICD-10-CM

## 2024-11-09 DIAGNOSIS — Z3A36 36 weeks gestation of pregnancy: Secondary | ICD-10-CM | POA: Diagnosis not present

## 2024-11-09 DIAGNOSIS — O0993 Supervision of high risk pregnancy, unspecified, third trimester: Secondary | ICD-10-CM | POA: Diagnosis not present

## 2024-11-09 DIAGNOSIS — O99213 Obesity complicating pregnancy, third trimester: Secondary | ICD-10-CM

## 2024-11-09 DIAGNOSIS — O09523 Supervision of elderly multigravida, third trimester: Secondary | ICD-10-CM

## 2024-11-09 DIAGNOSIS — O24410 Gestational diabetes mellitus in pregnancy, diet controlled: Secondary | ICD-10-CM | POA: Diagnosis not present

## 2024-11-09 DIAGNOSIS — Z3490 Encounter for supervision of normal pregnancy, unspecified, unspecified trimester: Secondary | ICD-10-CM

## 2024-11-09 DIAGNOSIS — O99210 Obesity complicating pregnancy, unspecified trimester: Secondary | ICD-10-CM

## 2024-11-09 DIAGNOSIS — O0990 Supervision of high risk pregnancy, unspecified, unspecified trimester: Secondary | ICD-10-CM

## 2024-11-09 NOTE — Progress Notes (Signed)
 "  PRENATAL VISIT NOTE  Subjective:  Melanie Castillo is a 44 y.o. H4E5995 at [redacted]w[redacted]d being seen today for ongoing prenatal care.  She is currently monitored for the following issues for this high-risk pregnancy and has AMA (advanced maternal age) multigravida 35+; Maternal morbid obesity, antepartum (HCC); GAD (generalized anxiety disorder); Family history of Down syndrome; Supervision of high risk pregnancy, antepartum; Alpha thalassemia silent carrier; Diet controlled gestational diabetes mellitus (GDM), antepartum; Pregnancy with adoption planned, antepartum; and Abnormal Pap smear of cervix on their problem list.  Patient reports Darol Irving.  Contractions: Irritability.  .  Movement: Present. Denies leaking of fluid.   The following portions of the patient's history were reviewed and updated as appropriate: allergies, current medications, past family history, past medical history, past social history, past surgical history and problem list.   Objective:   Vitals:   11/09/24 1437  BP: 127/84  Pulse: 91  Weight: 265 lb 3.2 oz (120.3 kg)    Fetal Status:  Fetal Heart Rate (bpm): 147 Fundal Height: 37 cm Movement: Present    General: Alert, oriented and cooperative. Patient is in no acute distress.  Skin: Skin is warm and dry. No rash noted.   Cardiovascular: Normal heart rate noted  Respiratory: Normal respiratory effort, no problems with respiration noted  Abdomen: Soft, gravid, appropriate for gestational age.  Pain/Pressure: Present     Pelvic: Cervical exam deferred        Extremities: Normal range of motion.     Mental Status: Normal mood and affect. Normal behavior. Normal judgment and thought content.      09/08/2024    2:22 PM 03/05/2017    3:51 PM  Depression screen PHQ 2/9  Decreased Interest 2 0  Down, Depressed, Hopeless 2 0  PHQ - 2 Score 4 0  Altered sleeping 2   Tired, decreased energy 2   Change in appetite 0   Feeling bad or failure about yourself  2    Trouble concentrating 0   Moving slowly or fidgety/restless 0   Suicidal thoughts 0   PHQ-9 Score 10         09/08/2024    2:22 PM  GAD 7 : Generalized Anxiety Score  Nervous, Anxious, on Edge 0   Control/stop worrying 2   Worry too much - different things 2   Trouble relaxing 2   Restless 0   Easily annoyed or irritable 2   Afraid - awful might happen 0   Total GAD 7 Score 8     Data saved with a previous flowsheet row definition    Assessment and Plan:  Pregnancy: G5P4004 at [redacted]w[redacted]d 1. Supervision of high risk pregnancy, antepartum (Primary) - Doing well, feeling regular and vigorous fetal movement   2. [redacted] weeks gestation of pregnancy - Routine PNC, anticipatory guidance.    3. Diet controlled gestational diabetes mellitus (GDM), antepartum - Reports most CBGs in normal range. - Discussion of metformin  today, patient declines - IOL 11/18/24: discussed methods  4. Pregnancy with adoption planned, antepartum - Open adoption, plans skin to skin  5. Maternal morbid obesity, antepartum (HCC) 6. Multigravida of advanced maternal age in third trimester 7. Obesity affecting pregnancy, antepartum, unspecified obesity type - Followed by MFM - IOL 11/18/24  Preterm labor symptoms and general obstetric precautions including but not limited to vaginal bleeding, contractions, leaking of fluid and fetal movement were reviewed in detail with the patient. Please refer to After Visit Summary for other counseling recommendations.  No follow-ups on file.  Future Appointments  Date Time Provider Department Center  11/16/2024  1:15 PM [facility] US2 [facility]  11/16/2024  2:35 PM Izell Harari, MD [facility]  11/18/2024  7:00 AM MC-LD SCHED ROOM [facility]-INDC None  11/29/2024  2:30 PM [facility] PROVIDER 1 [facility]  11/29/2024  3:00 PM [facility] US  1 [facility] [place]    Melanie Castillo, CNM  "

## 2024-11-11 ENCOUNTER — Encounter (HOSPITAL_COMMUNITY): Payer: Self-pay | Admitting: *Deleted

## 2024-11-11 ENCOUNTER — Telehealth (HOSPITAL_COMMUNITY): Payer: Self-pay | Admitting: *Deleted

## 2024-11-11 NOTE — Telephone Encounter (Signed)
 Contacted pt in regards to her concern.  Pt reports that her concern has been taken care of.  Pt did not have any other questions.   Reis Goga,RN  11/11/24

## 2024-11-11 NOTE — Telephone Encounter (Signed)
 Preadmission screen

## 2024-11-16 ENCOUNTER — Ambulatory Visit

## 2024-11-16 ENCOUNTER — Ambulatory Visit: Payer: Self-pay | Admitting: Obstetrics and Gynecology

## 2024-11-16 ENCOUNTER — Other Ambulatory Visit: Payer: Self-pay

## 2024-11-16 VITALS — BP 119/85 | HR 106 | Wt 264.0 lb

## 2024-11-16 DIAGNOSIS — O99210 Obesity complicating pregnancy, unspecified trimester: Secondary | ICD-10-CM

## 2024-11-16 DIAGNOSIS — R87619 Unspecified abnormal cytological findings in specimens from cervix uteri: Secondary | ICD-10-CM

## 2024-11-16 DIAGNOSIS — Z3A36 36 weeks gestation of pregnancy: Secondary | ICD-10-CM | POA: Diagnosis not present

## 2024-11-16 DIAGNOSIS — O99213 Obesity complicating pregnancy, third trimester: Secondary | ICD-10-CM

## 2024-11-16 DIAGNOSIS — Z3A37 37 weeks gestation of pregnancy: Secondary | ICD-10-CM

## 2024-11-16 DIAGNOSIS — Z6841 Body Mass Index (BMI) 40.0 and over, adult: Secondary | ICD-10-CM | POA: Diagnosis not present

## 2024-11-16 DIAGNOSIS — O24419 Gestational diabetes mellitus in pregnancy, unspecified control: Secondary | ICD-10-CM

## 2024-11-16 DIAGNOSIS — Z3490 Encounter for supervision of normal pregnancy, unspecified, unspecified trimester: Secondary | ICD-10-CM

## 2024-11-16 DIAGNOSIS — O09523 Supervision of elderly multigravida, third trimester: Secondary | ICD-10-CM | POA: Diagnosis not present

## 2024-11-16 LAB — GLUCOSE, CAPILLARY: Glucose-Capillary: 81 mg/dL (ref 70–99)

## 2024-11-16 NOTE — Progress Notes (Unsigned)
 "  PRENATAL VISIT NOTE  Subjective:  Melanie Castillo is a 44 y.o. H4E5995 at [redacted]w[redacted]d being seen today for ongoing prenatal care.  She is currently monitored for the following issues for this high-risk pregnancy and has AMA (advanced maternal age) multigravida 35+; Maternal morbid obesity, antepartum (HCC); GAD (generalized anxiety disorder); Family history of Down syndrome; Supervision of high risk pregnancy, antepartum; Alpha thalassemia silent carrier; Diet controlled gestational diabetes mellitus (GDM), antepartum; Pregnancy with adoption planned, antepartum; and Abnormal Pap smear of cervix on their problem list.  Patient reports no complaints.  Contractions: Irregular. Vag. Bleeding: None.  Movement: Present. Denies leaking of fluid.   The following portions of the patient's history were reviewed and updated as appropriate: allergies, current medications, past family history, past medical history, past social history, past surgical history and problem list.   Objective:   Vitals:   11/16/24 1454  BP: 119/85  Pulse: (!) 106  Weight: 264 lb (119.7 kg)    Fetal Status:  Fetal Heart Rate (bpm): 141   Movement: Present Presentation: Vertex  General: Alert, oriented and cooperative. Patient is in no acute distress.  Skin: Skin is warm and dry. No rash noted.   Cardiovascular: Normal heart rate noted  Respiratory: Normal respiratory effort, no problems with respiration noted  Abdomen: Soft, gravid, appropriate for gestational age.  Pain/Pressure: Present     Pelvic: Cervical exam deferred Dilation: 1 Effacement (%): 20 Station: -4 (Ballotable)  Extremities: Normal range of motion.  Edema: Trace  Mental Status: Normal mood and affect. Normal behavior. Normal judgment and thought content.      09/08/2024    2:22 PM 03/05/2017    3:51 PM  Depression screen PHQ 2/9  Decreased Interest 2 0  Down, Depressed, Hopeless 2 0  PHQ - 2 Score 4 0  Altered sleeping 2   Tired, decreased energy 2    Change in appetite 0   Feeling bad or failure about yourself  2   Trouble concentrating 0   Moving slowly or fidgety/restless 0   Suicidal thoughts 0   PHQ-9 Score 10         09/08/2024    2:22 PM  GAD 7 : Generalized Anxiety Score  Nervous, Anxious, on Edge 0   Control/stop worrying 2   Worry too much - different things 2   Trouble relaxing 2   Restless 0   Easily annoyed or irritable 2   Afraid - awful might happen 0   Total GAD 7 Score 8     Data saved with a previous flowsheet row definition    Assessment and Plan:  Pregnancy: G5P4004 at [redacted]w[redacted]d 1. [redacted] weeks gestation of pregnancy (Primary) GBS neg I saw after her visit that her BTL papers were signed on 1/9 and her IOL is on 2/5. So would be unable to do due to 30d wait; patient aware that may not have been able to do anyway due to BMI  2. Gestational diabetes mellitus (GDM) in third trimester, gestational diabetes method of control unspecified Not on any medications. Patient w/o meter and hasn't check sugars since this weekend due to this but states are normal. CBG 86 today in the office 3h after lunch. Add on for bpp 8/8, cephalic, afi 10.6. for 2/5 AM IOL  3. Pregnancy with adoption planned, antepartum  4. Maternal morbid obesity, antepartum (HCC)  5. BMI 40.0-44.9, adult (HCC)  6. Multigravida of advanced maternal age in third trimester  7. Abnormal cervical Papanicolaou  smear, unspecified abnormal pap finding Needs PP colpo  Preterm labor symptoms and general obstetric precautions including but not limited to vaginal bleeding, contractions, leaking of fluid and fetal movement were reviewed in detail with the patient. Please refer to After Visit Summary for other counseling recommendations.   Return if symptoms worsen or fail to improve.  Future Appointments  Date Time Provider Department Center  11/18/2024  7:00 AM MC-LD SCHED ROOM [facility] None  11/29/2024  2:30 PM [facility] PROVIDER 1 [facility]  11/29/2024   3:00 PM [facility]-[place] US  1 [facility] [place]    Bebe Furry, MD  "

## 2024-11-18 ENCOUNTER — Inpatient Hospital Stay (HOSPITAL_COMMUNITY): Admitting: Anesthesiology

## 2024-11-18 ENCOUNTER — Encounter (HOSPITAL_COMMUNITY): Payer: Self-pay | Admitting: Obstetrics and Gynecology

## 2024-11-18 ENCOUNTER — Inpatient Hospital Stay (HOSPITAL_COMMUNITY)
Admission: RE | Admit: 2024-11-18 | Source: Home / Self Care | Attending: Obstetrics and Gynecology | Admitting: Obstetrics and Gynecology

## 2024-11-18 ENCOUNTER — Other Ambulatory Visit: Payer: Self-pay

## 2024-11-18 ENCOUNTER — Inpatient Hospital Stay (HOSPITAL_COMMUNITY)

## 2024-11-18 DIAGNOSIS — O24410 Gestational diabetes mellitus in pregnancy, diet controlled: Secondary | ICD-10-CM | POA: Diagnosis present

## 2024-11-18 DIAGNOSIS — O09529 Supervision of elderly multigravida, unspecified trimester: Secondary | ICD-10-CM

## 2024-11-18 DIAGNOSIS — D563 Thalassemia minor: Secondary | ICD-10-CM

## 2024-11-18 DIAGNOSIS — O0990 Supervision of high risk pregnancy, unspecified, unspecified trimester: Secondary | ICD-10-CM

## 2024-11-18 DIAGNOSIS — O24419 Gestational diabetes mellitus in pregnancy, unspecified control: Secondary | ICD-10-CM

## 2024-11-18 LAB — CBC
HCT: 35.5 % — ABNORMAL LOW (ref 36.0–46.0)
Hemoglobin: 11.6 g/dL — ABNORMAL LOW (ref 12.0–15.0)
MCH: 23.9 pg — ABNORMAL LOW (ref 26.0–34.0)
MCHC: 32.7 g/dL (ref 30.0–36.0)
MCV: 73 fL — ABNORMAL LOW (ref 80.0–100.0)
Platelets: 177 10*3/uL (ref 150–400)
RBC: 4.86 MIL/uL (ref 3.87–5.11)
RDW: 15.4 % (ref 11.5–15.5)
WBC: 8.5 10*3/uL (ref 4.0–10.5)
nRBC: 0 % (ref 0.0–0.2)

## 2024-11-18 LAB — GLUCOSE, CAPILLARY
Glucose-Capillary: 106 mg/dL — ABNORMAL HIGH (ref 70–99)
Glucose-Capillary: 85 mg/dL (ref 70–99)

## 2024-11-18 LAB — TYPE AND SCREEN
ABO/RH(D): B POS
Antibody Screen: NEGATIVE

## 2024-11-18 LAB — SYPHILIS: RPR W/REFLEX TO RPR TITER AND TREPONEMAL ANTIBODIES, TRADITIONAL SCREENING AND DIAGNOSIS ALGORITHM: RPR Ser Ql: NONREACTIVE

## 2024-11-18 MED ORDER — LIDOCAINE HCL (PF) 1 % IJ SOLN: 30.0000 mL | INTRAMUSCULAR | Status: DC | PRN

## 2024-11-18 MED ORDER — ACETAMINOPHEN 325 MG PO TABS: 650.0000 mg | ORAL_TABLET | ORAL | Status: DC | PRN

## 2024-11-18 MED ORDER — OXYTOCIN-SODIUM CHLORIDE 30-0.9 UT/500ML-% IV SOLN
2.5000 [IU]/h | INTRAVENOUS | Status: DC
  Filled 2024-11-18: qty 500

## 2024-11-18 MED ORDER — FENTANYL-BUPIVACAINE-NACL 0.5-0.125-0.9 MG/250ML-% EP SOLN
12.0000 mL/h | EPIDURAL | Status: DC | PRN
  Administered 2024-11-18: 12 mL/h via EPIDURAL
  Filled 2024-11-18: qty 250

## 2024-11-18 MED ORDER — TERBUTALINE SULFATE 1 MG/ML IJ SOLN: 0.2500 mg | Freq: Once | INTRAMUSCULAR | Status: DC | PRN

## 2024-11-18 MED ORDER — MISOPROSTOL 50MCG HALF TABLET
50.0000 ug | ORAL_TABLET | ORAL | Status: DC | PRN
  Administered 2024-11-18: 50 ug via ORAL
  Filled 2024-11-18: qty 1

## 2024-11-18 MED ORDER — LACTATED RINGERS IV SOLN: INTRAVENOUS | Status: DC

## 2024-11-18 MED ORDER — ONDANSETRON HCL 4 MG/2ML IJ SOLN
4.0000 mg | Freq: Four times a day (QID) | INTRAMUSCULAR | Status: DC | PRN
  Administered 2024-11-19: 4 mg via INTRAVENOUS
  Filled 2024-11-18 (×2): qty 2

## 2024-11-18 MED ORDER — OXYTOCIN-SODIUM CHLORIDE 30-0.9 UT/500ML-% IV SOLN
1.0000 m[IU]/min | INTRAVENOUS | Status: DC
  Administered 2024-11-18: 2 m[IU]/min via INTRAVENOUS

## 2024-11-18 MED ORDER — EPHEDRINE 5 MG/ML INJ: 10.0000 mg | INTRAVENOUS | Status: DC | PRN

## 2024-11-18 MED ORDER — TRANEXAMIC ACID-NACL 1000-0.7 MG/100ML-% IV SOLN
1000.0000 mg | INTRAVENOUS | Status: DC
  Filled 2024-11-18: qty 100

## 2024-11-18 MED ORDER — DIPHENHYDRAMINE HCL 50 MG/ML IJ SOLN
12.5000 mg | INTRAMUSCULAR | Status: DC | PRN
  Filled 2024-11-18: qty 1

## 2024-11-18 MED ORDER — FENTANYL CITRATE (PF) 100 MCG/2ML IJ SOLN
50.0000 ug | INTRAMUSCULAR | Status: DC | PRN
  Administered 2024-11-18: 100 ug via INTRAVENOUS
  Filled 2024-11-18: qty 2

## 2024-11-18 MED ORDER — OXYTOCIN BOLUS FROM INFUSION: 333.0000 mL | Freq: Once | INTRAVENOUS | Status: DC

## 2024-11-18 MED ORDER — PHENYLEPHRINE 80 MCG/ML (10ML) SYRINGE FOR IV PUSH (FOR BLOOD PRESSURE SUPPORT)
80.0000 ug | PREFILLED_SYRINGE | INTRAVENOUS | Status: DC | PRN
  Filled 2024-11-18: qty 10

## 2024-11-18 MED ORDER — LEVONORGESTREL 20 MCG/DAY IU IUD
1.0000 | INTRAUTERINE_SYSTEM | Freq: Once | INTRAUTERINE | Status: DC
  Filled 2024-11-18: qty 1

## 2024-11-18 MED ORDER — LIDOCAINE HCL (PF) 1 % IJ SOLN
INTRAMUSCULAR | Status: DC | PRN
  Administered 2024-11-18 (×2): 5 mL via EPIDURAL

## 2024-11-18 MED ORDER — SOD CITRATE-CITRIC ACID 500-334 MG/5ML PO SOLN: 30.0000 mL | ORAL | Status: DC | PRN

## 2024-11-18 MED ORDER — LACTATED RINGERS IV SOLN: 500.0000 mL | INTRAVENOUS | Status: DC | PRN

## 2024-11-18 MED ORDER — PHENYLEPHRINE 80 MCG/ML (10ML) SYRINGE FOR IV PUSH (FOR BLOOD PRESSURE SUPPORT): 80.0000 ug | PREFILLED_SYRINGE | INTRAVENOUS | Status: DC | PRN

## 2024-11-18 MED ORDER — HYDROXYZINE HCL 50 MG PO TABS
50.0000 mg | ORAL_TABLET | Freq: Three times a day (TID) | ORAL | Status: DC | PRN
  Administered 2024-11-18 – 2024-11-19 (×2): 50 mg via ORAL
  Filled 2024-11-18 (×2): qty 1

## 2024-11-18 MED ORDER — LACTATED RINGERS IV SOLN
500.0000 mL | Freq: Once | INTRAVENOUS | Status: AC
  Administered 2024-11-18: 500 mL via INTRAVENOUS

## 2024-11-18 NOTE — Progress Notes (Signed)
 Patient Vitals for the past 4 hrs:  BP Temp Temp src Pulse Resp SpO2  11/18/24 2200 127/70 -- -- 85 -- 97 %  11/18/24 2120 123/65 -- -- 93 -- --  11/18/24 2115 (!) 145/105 -- -- 97 -- 99 %  11/18/24 2110 129/61 -- -- 92 -- 98 %  11/18/24 2105 135/80 -- -- 98 -- --  11/18/24 2100 116/75 -- -- 95 -- 99 %  11/18/24 2030 (!) 144/85 98.2 F (36.8 C) Oral 91 16 --   Comfortable w/epidural. Balloon out, cx 3.5/thick/-2.  AROM w/clear fluid. FHR Cat 1, ctx mild an irregular q 2-5 minutes.  Will start pitocin .

## 2024-11-18 NOTE — Anesthesia Preprocedure Evaluation (Signed)
 "                                  Anesthesia Evaluation  Patient identified by MRN, date of birth, ID band Patient awake    Reviewed: Allergy & Precautions, Patient's Chart, lab work & pertinent test results  Airway Mallampati: III  TM Distance: >3 FB     Dental no notable dental hx.    Pulmonary neg pulmonary ROS   Pulmonary exam normal        Cardiovascular hypertension, Normal cardiovascular exam Rhythm:Regular     Neuro/Psych  PSYCHIATRIC DISORDERS Anxiety     negative neurological ROS     GI/Hepatic ,GERD  ,,  Endo/Other  diabetes, Well Controlled, Gestational, Oral Hypoglycemic Agents  Class 3 obesity  Renal/GU negative Renal ROS  negative genitourinary   Musculoskeletal negative musculoskeletal ROS (+)    Abdominal  (+) + obese  Peds  Hematology  (+) Blood dyscrasia, anemia Lab Results      Component                Value               Date                      WBC                      8.5                 11/18/2024                HGB                      11.6 (L)            11/18/2024                HCT                      35.5 (L)            11/18/2024                MCV                      73.0 (L)            11/18/2024                PLT                      177                 11/18/2024              Anesthesia Other Findings   Reproductive/Obstetrics (+) Pregnancy AMA GDM                              Anesthesia Physical Anesthesia Plan  ASA: 3  Anesthesia Plan: Epidural   Post-op Pain Management: Minimal or no pain anticipated   Induction: Intravenous  PONV Risk Score and Plan: Treatment may vary due to age or medical condition  Airway Management Planned: Natural Airway  Additional Equipment: Fetal Monitoring and None  Intra-op Plan:   Post-operative Plan:   Informed Consent:  I have reviewed the patients History and Physical, chart, labs and discussed the procedure including the risks,  benefits and alternatives for the proposed anesthesia with the patient or authorized representative who has indicated his/her understanding and acceptance.       Plan Discussed with: Anesthesiologist  Anesthesia Plan Comments:         Anesthesia Quick Evaluation  "

## 2024-11-18 NOTE — Anesthesia Procedure Notes (Signed)
 Epidural Patient location during procedure: OB Start time: 11/18/2024 8:49 PM End time: 11/18/2024 8:58 PM  Staffing Anesthesiologist: Jerrye Sharper, MD Performed: anesthesiologist   Preanesthetic Checklist Completed: patient identified, IV checked, site marked, risks and benefits discussed, surgical consent, monitors and equipment checked, pre-op evaluation and timeout performed  Epidural Patient position: sitting Prep: DuraPrep and site prepped and draped Patient monitoring: continuous pulse ox and blood pressure Approach: midline Location: L3-L4 Injection technique: LOR air  Needle:  Needle type: Tuohy  Needle gauge: 17 G Needle length: 9 cm and 9 Needle insertion depth: 8 cm Catheter type: closed end flexible Catheter size: 19 Gauge Catheter at skin depth: 15 cm Test dose: negative and Other  Assessment Events: blood not aspirated, no cerebrospinal fluid, injection not painful, no injection resistance, no paresthesia and negative IV test  Additional Notes Patient identified. Risks and benefits discussed including failed block, incomplete  Pain control, post dural puncture headache, nerve damage, paralysis, blood pressure Changes, nausea, vomiting, reactions to medications-both toxic and allergic and post Partum back pain. All questions were answered. Patient expressed understanding and wished to proceed. Sterile technique was used throughout procedure. Epidural site was Dressed with sterile barrier dressing. No paresthesias, signs of intravascular injection Or signs of intrathecal spread were encountered.  Patient was more comfortable after the epidural was dosed. Please see RN's note for documentation of vital signs and FHR which are stable.

## 2024-11-18 NOTE — Progress Notes (Addendum)
 Labor Progress Note Melanie Castillo is a 44 y.o. H4E5995 at [redacted]w[redacted]d presented for IOL for Melanie Castillo  S: feeling more uncomfortable with contractions  O:  BP 131/82   Pulse 93   Temp 98.8 F (37.1 C) (Oral)   Resp 16   Ht 5' 4.5 (1.638 m)   Wt 119.4 kg   BMI 44.48 kg/m  EFM: 140bpm/good variability/accels present/ no decels  CVE: Dilation: 2 Effacement (%): 90 Station: -2 Presentation: Vertex Exam by:: CANDIE Rote, CNM   A&P: 44 y.o. H4E5995 [redacted]w[redacted]d for IOL for Melanie Castillo  #Labor: Progressing well. After discussion with patient, placed FB. Placed easily and filled with 60mL of fluid by RN staff. Patient tolerated well. Defer cytotec  for now due to contraction pattern, may consider if FB remains in situ in several hours.   #Pain: IV pain meds available, patient planning on epidural once further along #FWB: Category I tracing #GBS negative  #Melanie Castillo - Bgs Q4H   #Plan for adoption - Patient would like skin to skin after delivery # Expect SVB.   Garen SHAUNNA Puffer, MD 5:52 PM  Attestation of Supervision of Student:  I confirm that I have verified the information documented in the medical residents note and that I have also personally supervised the history, physical exam and all medical decision making activities.  I have verified that all services and findings are accurately documented in this student's note; and I agree with management and plan as outlined in the documentation. I have also made any necessary editorial changes.  Camie DELENA Rote, CNM [facility] for [employer], [facility] 11/18/2024 6:14 PM

## 2024-11-18 NOTE — H&P (Addendum)
 OBSTETRIC ADMISSION HISTORY AND PHYSICAL  CHRISTINE MORTON is a 44 y.o. female [phone] with IUP at [redacted]w[redacted]d by US  presenting for IOL for GDM. She reports +FMs, No LOF, no VB, no blurry vision, headaches or peripheral edema, and RUQ pain.  She plans on bottle feeding. She requests BTL for birth control. She received her prenatal [facility]   Dating: By US  --->  Estimated Date of Delivery: 12/09/24  Sono:    @[redacted]w[redacted]d , CWD, normal anatomy, cephalic presentation, anterior placenta, 2510g, 63% EFW   Prenatal History/Complications:         NURSING  PROVIDER  Cons[facility]ator, [facility] Dating by U/S at 23 wks  [facility] Model Traditional Anatomy U/S  10/27/24, wait list for earlier  Initiated care at  26wks                 Language  English               LAB RESULTS   Support Person  Patient's Sister Genetics NIPS: LR F AFP:       NT/IT (FT only)        Carrier Screen Horizon: SILENT CARRIER for Alpha-Thalassemia (aa/a-)   Rhogam  B/Positive/-- (11/26 1052) A1C/GTT Early HgbA1C:  Third trimester 2 hr GTT: gDM yes  Flu Vaccine Decline 09/22/24      TDaP Vaccine 11/03/23  Blood Type B/Positive/-- (11/26 1052)  RSV Vaccine   Antibody Negative (11/26 1052)  COVID Vaccine   Rubella 1.40 (11/26 1052)  Feeding Plan Formula RPR Non Reactive (11/26 1052)  Contraception bilateral tubal ligation HBsAg Negative (11/26 1052)  Circumcision  Yes, if boy HIV Non Reactive (11/26 1052)  Pediatrician    HCVAb Non Reactive (11/26 1052)  Prenatal Classes        BTL Consent 10/22/24 Pap No results found for: DIAGPAP  BTL Pre-payment   GC/CT Initial:   36wks:    VBAC Consent   GBS For PCN allergy, check sensitivities   BRx Optimized? [ ]  yes   [ ]  no      DME Rx [ ]  BP cuff [ ]  Weight Scale Waterbirth  [ ]  Class [ ]  Consent [ ]  CNM visit  PHQ9 & GAD7 [  ] new OB [  ] 28 weeks  [  ] 36 weeks Induction  [ ]  Orders Entered [ ] Foley Y/N     Past Medical History: Past Medical History:  Diagnosis Date    Anxiety    Class 3 severe obesity without serious comorbidity with body mass index (BMI) of 40.0 to 44.9 in adult [facility]) 06/07/2022   Family history of colon cancer in father 06/07/2022   Early 50's diagnosis     Gestational diabetes    glyburide    Hypertension    Pregnancy 2018   Maternal age 54+, multigravida, antepartum 10/17/2016   44 years old-declines all genetic testing. Oldest son 71 yo has downs syndrome     Medical history non-contributory    Pain in right knee 12/23/2017   Pain in right knee 12/23/2017   Pregnancy 08/27/2016   Vertigo     Past Surgical History: Past Surgical History:  Procedure Laterality Date   NO PAST SURGERIES      Obstetrical History: OB History     Gravida  5   Para  4   Term  4   Preterm  0   AB  0   Living  4  SAB  0   IAB  0   Ectopic  0   Multiple  0   Live Births  4           Social History Social History   Socioeconomic History   Marital status: Divorced    Spouse name: Not on file   Number of children: 4   Years of education: Not on file   Highest education level: Not on file  Occupational History   Not on file  Tobacco Use   Smoking status: Never   Smokeless tobacco: Never  Vaping Use   Vaping status: Never Used  Substance and Sexual Activity   Alcohol use: Not Currently    Comment: before knew about pregnancy   Drug use: No   Sexual activity: Not Currently    Partners: Male  Other Topics Concern   Not on file  Social History Narrative   Not on file   Social Drivers of Health   Tobacco Use: Low Risk (11/18/2024)   Patient History    Smoking Tobacco Use: Never    Smokeless Tobacco Use: Never    Passive Exposure: Not on file  Financial Resource Strain: Not on file  Food Insecurity: Food Insecurity Present (11/18/2024)   Epic    Worried About Programme Researcher, Broadcasting/film/video in the Last Year: Sometimes true    Ran Out of Food in the Last Year: Sometimes true  Transportation Needs: No Transportation  Needs (11/18/2024)   Epic    Lack of Transportation (Medical): No    Lack of Transportation (Non-Medical): No  Physical Activity: Not on file  Stress: Not on file  Social Connections: Not on file  Depression (PHQ2-9): Medium Risk (09/08/2024)   Depression (PHQ2-9)    PHQ-2 Score: 10  Alcohol Screen: Not on file  Housing: Low Risk (11/18/2024)   Epic    Unable to Pay for Housing in the Last Year: No    Number of Times Moved in the Last Year: 0    Homeless in the Last Year: No  Utilities: Not At Risk (11/18/2024)   Epic    Threatened with loss of utilities: No  Health Literacy: Not on file    Family History: Family History  Problem Relation Age of Onset   Diabetes Mother    Hypertension Mother    Thyroid disease Mother    Cancer Mother    Colon cancer Father    Down syndrome Son    Seizures Son    Diabetes Other    Hypertension Other    Breast cancer Neg Hx     Allergies: Allergies[1]  Medications Prior to Admission  Medication Sig Dispense Refill Last Dose/Taking   ALPRAZolam  (XANAX ) 0.25 MG tablet Take 1 tablet (0.25 mg total) by mouth 3 (three) times daily as needed for anxiety. 10 tablet 0 11/17/2024   aspirin  EC 81 MG tablet Take 1 tablet (81 mg total) by mouth daily. Take after 12 weeks for prevention of preeclampsia later in pregnancy 90 tablet 0 11/17/2024   Prenatal Vit-Fe Fumarate-FA (TRINATE) TABS Take 1 tablet by mouth daily. 28 tablet 11 11/17/2024   Accu-Chek Softclix Lancets lancets Check glucose four times a day (Patient not taking: Reported on 11/16/2024) 100 each 12    glucose blood test strip Check blood glucose four times a day (Patient not taking: Reported on 11/16/2024) 100 each 12    metFORMIN  (GLUCOPHAGE -XR) 500 MG 24 hr tablet Take 1 tablet (500 mg total) by mouth at bedtime. (  Patient not taking: Reported on 11/16/2024) 30 tablet 1      Review of Systems   All systems reviewed and negative except as stated in HPI  currently breastfeeding. General  appearance: alert and no distress Lungs: normal respiratory effort Heart: regular rate  Abdomen: gravid Extremities: no sign of DVT Presentation: cephalic Fetal monitoring Baseline: 145 bpm, Variability: Good {> 6 bpm), and Accelerations: Reactive, one variable with recovery to baseline Uterine activity None Dilation: 1 Effacement (%): 50 Station: -4 (Ballotable) Exam by:: KYM Eagles, RNC   Prenatal labs: ABO, Rh: B/Positive/-- (11/26 1052) Antibody: Negative (11/26 1052) Rubella: 1.40 (11/26 1052) RPR: Non Reactive (11/26 1052)  HBsAg: Negative (11/26 1052)  HIV: Non Reactive (11/26 1052)  GBS: Negative/-- (01/20 1525)    Lab Results  Component Value Date   GBS Negative 11/02/2024   GTT - GDM Genetic screening -  alpha thalassemia carrier  Anatomy US  - normal, advanced gestational age  Immunization History  Administered Date(s) Administered   DTP 03/31/1980, 05/31/1980, 08/18/1980, 01/11/1982, 02/02/1985   Hep A / Hep B 09/14/2008   Influenza,inj,Quad PF,6+ Mos 07/29/2016   Influenza,inj,quad, With Preservative 07/14/2016   Influenza-Unspecified 12/23/2007   MMR 06/29/1981, 01/18/2002   OPV 03/31/1980, 05/31/1980, 08/18/1980, 01/11/1982, 02/02/1985   Td (Adult),5 Lf Tetanus Toxid, Preservative Free 02/25/2008   Tdap 03/19/2017, 11/02/2024   Unspecified SARS-COV-2 Vaccination 12/23/2019, 01/20/2020    Prenatal Transfer Tool  Maternal Diabetes: Yes:  Diabetes Type:  Diet controlled Genetic Screening: Normal, alpha thalassemia carrier Maternal Ultrasounds/Referrals: Normal Fetal Ultrasounds or other Referrals:  None Maternal Substance Abuse:  No Significant Maternal Medications:  None Significant Maternal Lab Results: Group B Strep negative Number of Prenatal Visits:Less than or equal to 3 verified prenatal visits Maternal Vaccinations:TDap Other Comments:  None   No results found for this or any previous visit (from the past 24 hours).  Patient Active Problem  List   Diagnosis Date Noted   Indication for care in labor or delivery 11/18/2024   Abnormal Pap smear of cervix 11/04/2024   Diet controlled gestational diabetes mellitus (GDM), antepartum 10/13/2024   Pregnancy with adoption planned, antepartum 10/13/2024   Alpha thalassemia silent carrier 09/22/2024   Family history of Down syndrome 09/02/2024   Supervision of high risk pregnancy, antepartum 09/02/2024   GAD (generalized anxiety disorder) 04/22/2019   AMA (advanced maternal age) multigravida 35+ 11/27/2016   Maternal morbid obesity, antepartum (HCC) 10/17/2016    Assessment/Plan:  Buford LITTIE Shields is a 44 y.o. H4E5995 at [redacted]w[redacted]d here for IOL for [name]  #Labor:IOL for GDM Per discussion with patient, will start with cytotec . Can consider FB vs cytotec  at next check.   #Pain: Per patient preference. Planning on epidural  #FWB: Category I tracing, continue to monitor.  #GBS status:  negative #Feeding: Formula #Reproductive Life planning: Tubal Ligation originally, but paperwork not signed within timeframe. Patient interested in post placental IUD as alternative while waiting for interval BTL.  #Circ:  not applicable  #[name] - Bgs Q4H  #Plan for adoption - Patient would like skin to skin after delivery  #Abnormal pap - post partum colpo  Garen SHAUNNA Puffer, MD  11/18/2024, 12:06 PM        [1]  Allergies Allergen Reactions   Wasp Venom Anaphylaxis

## 2024-11-19 ENCOUNTER — Encounter (HOSPITAL_COMMUNITY): Payer: Self-pay | Admitting: Obstetrics and Gynecology

## 2024-11-19 ENCOUNTER — Encounter (HOSPITAL_COMMUNITY): Admission: RE | Payer: Self-pay | Source: Home / Self Care | Attending: Obstetrics and Gynecology

## 2024-11-19 LAB — GLUCOSE, CAPILLARY
Glucose-Capillary: 93 mg/dL (ref 70–99)
Glucose-Capillary: 97 mg/dL (ref 70–99)
Glucose-Capillary: 111 mg/dL — ABNORMAL HIGH (ref 70–99)

## 2024-11-19 MED ORDER — FENTANYL CITRATE (PF) 100 MCG/2ML IJ SOLN
INTRAMUSCULAR | Status: DC | PRN
  Administered 2024-11-19: 100 ug via EPIDURAL

## 2024-11-19 MED ORDER — WITCH HAZEL-GLYCERIN EX PADS: 1.0000 | MEDICATED_PAD | CUTANEOUS | Status: AC | PRN

## 2024-11-19 MED ORDER — LIDOCAINE-EPINEPHRINE (PF) 2 %-1:200000 IJ SOLN
INTRAMUSCULAR | Status: DC | PRN
  Administered 2024-11-19 (×2): 5 mL via EPIDURAL

## 2024-11-19 MED ORDER — ACETAMINOPHEN 500 MG PO TABS: 1000.0000 mg | ORAL_TABLET | Freq: Four times a day (QID) | ORAL | Status: DC

## 2024-11-19 MED ORDER — TRANEXAMIC ACID-NACL 1000-0.7 MG/100ML-% IV SOLN
INTRAVENOUS | Status: DC | PRN
  Administered 2024-11-19: 1000 mg via INTRAVENOUS

## 2024-11-19 MED ORDER — MORPHINE SULFATE (PF) 0.5 MG/ML IJ SOLN
INTRAMUSCULAR | Status: AC
  Filled 2024-11-19: qty 10

## 2024-11-19 MED ORDER — STERILE WATER FOR IRRIGATION IR SOLN
Status: DC | PRN
  Administered 2024-11-19: 1

## 2024-11-19 MED ORDER — ONDANSETRON HCL 4 MG/2ML IJ SOLN
INTRAMUSCULAR | Status: DC | PRN
  Administered 2024-11-19: 4 mg via INTRAVENOUS

## 2024-11-19 MED ORDER — MEASLES, MUMPS & RUBELLA VAC ~~LOC~~ SUSR: 0.5000 mL | Freq: Once | SUBCUTANEOUS | Status: AC

## 2024-11-19 MED ORDER — TRANEXAMIC ACID-NACL 1000-0.7 MG/100ML-% IV SOLN: 1000.0000 mg | Freq: Once | INTRAVENOUS | Status: DC

## 2024-11-19 MED ORDER — PHENYLEPHRINE 80 MCG/ML (10ML) SYRINGE FOR IV PUSH (FOR BLOOD PRESSURE SUPPORT)
PREFILLED_SYRINGE | INTRAVENOUS | Status: DC | PRN
  Administered 2024-11-19: 80 ug via INTRAVENOUS

## 2024-11-19 MED ORDER — ALPRAZOLAM 0.25 MG PO TABS
0.2500 mg | ORAL_TABLET | Freq: Three times a day (TID) | ORAL | Status: AC | PRN
  Administered 2024-11-19: 0.25 mg via ORAL
  Filled 2024-11-19: qty 1

## 2024-11-19 MED ORDER — GENTAMICIN SULFATE 40 MG/ML IJ SOLN
5.0000 mg/kg | INTRAVENOUS | Status: DC
  Administered 2024-11-19: 410 mg via INTRAVENOUS
  Filled 2024-11-19: qty 10.25

## 2024-11-19 MED ORDER — KETOROLAC TROMETHAMINE 30 MG/ML IJ SOLN: 30.0000 mg | Freq: Four times a day (QID) | INTRAMUSCULAR | Status: DC | PRN

## 2024-11-19 MED ORDER — NALOXONE HCL 0.4 MG/ML IJ SOLN: 0.4000 mg | INTRAMUSCULAR | Status: AC | PRN

## 2024-11-19 MED ORDER — PRENATAL MULTIVITAMIN CH
1.0000 | ORAL_TABLET | Freq: Every day | ORAL | Status: AC
  Administered 2024-11-19: 1 via ORAL
  Filled 2024-11-19: qty 1

## 2024-11-19 MED ORDER — DIBUCAINE (PERIANAL) 1 % EX OINT: 1.0000 | TOPICAL_OINTMENT | CUTANEOUS | Status: AC | PRN

## 2024-11-19 MED ORDER — METOCLOPRAMIDE HCL 5 MG/ML IJ SOLN
10.0000 mg | Freq: Once | INTRAMUSCULAR | Status: AC
  Administered 2024-11-19: 10 mg via INTRAVENOUS
  Filled 2024-11-19: qty 2

## 2024-11-19 MED ORDER — MORPHINE SULFATE (PF) 0.5 MG/ML IJ SOLN
INTRAMUSCULAR | Status: DC | PRN
  Administered 2024-11-19: 3 mg via EPIDURAL

## 2024-11-19 MED ORDER — KETOROLAC TROMETHAMINE 30 MG/ML IJ SOLN
INTRAMUSCULAR | Status: AC
  Filled 2024-11-19: qty 1

## 2024-11-19 MED ORDER — DIPHENHYDRAMINE HCL 50 MG/ML IJ SOLN: 12.5000 mg | INTRAMUSCULAR | Status: AC | PRN

## 2024-11-19 MED ORDER — SODIUM CHLORIDE 0.9% FLUSH: 3.0000 mL | INTRAVENOUS | Status: AC | PRN

## 2024-11-19 MED ORDER — ONDANSETRON HCL 4 MG/2ML IJ SOLN: 4.0000 mg | Freq: Three times a day (TID) | INTRAMUSCULAR | Status: AC | PRN

## 2024-11-19 MED ORDER — SOD CITRATE-CITRIC ACID 500-334 MG/5ML PO SOLN: 30.0000 mL | ORAL | Status: DC

## 2024-11-19 MED ORDER — SENNOSIDES-DOCUSATE SODIUM 8.6-50 MG PO TABS
2.0000 | ORAL_TABLET | ORAL | Status: AC
  Administered 2024-11-19: 2 via ORAL
  Filled 2024-11-19: qty 2

## 2024-11-19 MED ORDER — FENTANYL CITRATE (PF) 100 MCG/2ML IJ SOLN: 25.0000 ug | INTRAMUSCULAR | Status: DC | PRN

## 2024-11-19 MED ORDER — SODIUM CHLORIDE 0.9 % IV SOLN
INTRAVENOUS | Status: AC
  Filled 2024-11-19: qty 2000

## 2024-11-19 MED ORDER — DROPERIDOL 2.5 MG/ML IJ SOLN: 0.6250 mg | Freq: Once | INTRAMUSCULAR | Status: DC | PRN

## 2024-11-19 MED ORDER — SIMETHICONE 80 MG PO CHEW
80.0000 mg | CHEWABLE_TABLET | Freq: Three times a day (TID) | ORAL | Status: AC
  Administered 2024-11-19 (×2): 80 mg via ORAL
  Filled 2024-11-19 (×2): qty 1

## 2024-11-19 MED ORDER — ZOLPIDEM TARTRATE 5 MG PO TABS: 5.0000 mg | ORAL_TABLET | Freq: Every evening | ORAL | Status: AC | PRN

## 2024-11-19 MED ORDER — COCONUT OIL OIL: 1.0000 | TOPICAL_OIL | Status: AC | PRN

## 2024-11-19 MED ORDER — DIPHENHYDRAMINE HCL 25 MG PO CAPS: 25.0000 mg | ORAL_CAPSULE | Freq: Four times a day (QID) | ORAL | Status: AC | PRN

## 2024-11-19 MED ORDER — IBUPROFEN 600 MG PO TABS
600.0000 mg | ORAL_TABLET | Freq: Four times a day (QID) | ORAL | Status: AC
  Administered 2024-11-19 (×2): 600 mg via ORAL
  Filled 2024-11-19 (×2): qty 1

## 2024-11-19 MED ORDER — MENTHOL 3 MG MT LOZG: 1.0000 | LOZENGE | OROMUCOSAL | Status: AC | PRN

## 2024-11-19 MED ORDER — SODIUM CHLORIDE 0.9 % IR SOLN
Status: DC | PRN
  Administered 2024-11-19: 1

## 2024-11-19 MED ORDER — KETOROLAC TROMETHAMINE 30 MG/ML IJ SOLN
30.0000 mg | Freq: Once | INTRAMUSCULAR | Status: AC
  Administered 2024-11-19: 30 mg via INTRAVENOUS

## 2024-11-19 MED ORDER — GENTAMICIN SULFATE 40 MG/ML IJ SOLN
5.0000 mg/kg | INTRAVENOUS | Status: AC
  Filled 2024-11-19: qty 10.25

## 2024-11-19 MED ORDER — SODIUM CHLORIDE 0.9 % IV SOLN: 500.0000 mg | Freq: Once | INTRAVENOUS | Status: DC

## 2024-11-19 MED ORDER — METRONIDAZOLE 500 MG/100ML IV SOLN
500.0000 mg | Freq: Two times a day (BID) | INTRAVENOUS | Status: DC
  Filled 2024-11-19: qty 100

## 2024-11-19 MED ORDER — OXYCODONE HCL 5 MG PO TABS: 5.0000 mg | ORAL_TABLET | ORAL | Status: AC | PRN

## 2024-11-19 MED ORDER — OXYTOCIN-SODIUM CHLORIDE 30-0.9 UT/500ML-% IV SOLN
2.5000 [IU]/h | INTRAVENOUS | Status: AC
  Filled 2024-11-19: qty 500

## 2024-11-19 MED ORDER — SODIUM CHLORIDE 0.9 % IV SOLN
2.0000 g | Freq: Four times a day (QID) | INTRAVENOUS | Status: AC
  Administered 2024-11-19 (×2): 2 g via INTRAVENOUS
  Filled 2024-11-19: qty 2000

## 2024-11-19 MED ORDER — OXYTOCIN-SODIUM CHLORIDE 30-0.9 UT/500ML-% IV SOLN
INTRAVENOUS | Status: DC | PRN
  Administered 2024-11-19: 300 mL via INTRAVENOUS

## 2024-11-19 MED ORDER — DIPHENHYDRAMINE HCL 25 MG PO CAPS: 25.0000 mg | ORAL_CAPSULE | ORAL | Status: AC | PRN

## 2024-11-19 MED ORDER — LACTATED RINGERS IV SOLN: INTRAVENOUS | Status: AC

## 2024-11-19 MED ORDER — AZITHROMYCIN 500 MG PO TABS: 1000.0000 mg | ORAL_TABLET | Freq: Once | ORAL | Status: DC

## 2024-11-19 MED ORDER — DEXTROSE 5 % IV SOLN
INTRAVENOUS | Status: DC | PRN
  Administered 2024-11-19: 3 g via INTRAVENOUS

## 2024-11-19 MED ORDER — DEXAMETHASONE SOD PHOSPHATE PF 10 MG/ML IJ SOLN
INTRAMUSCULAR | Status: DC | PRN
  Administered 2024-11-19: 5 mg via INTRAVENOUS

## 2024-11-19 MED ORDER — CEFAZOLIN SODIUM-DEXTROSE 2-4 GM/100ML-% IV SOLN: 2.0000 g | INTRAVENOUS | Status: DC

## 2024-11-19 MED ORDER — DIPHENHYDRAMINE HCL 50 MG/ML IJ SOLN
25.0000 mg | Freq: Once | INTRAMUSCULAR | Status: AC
  Administered 2024-11-19: 25 mg via INTRAVENOUS

## 2024-11-19 MED ORDER — ACETAMINOPHEN 10 MG/ML IV SOLN
INTRAVENOUS | Status: DC | PRN
  Administered 2024-11-19: 1000 mg via INTRAVENOUS

## 2024-11-19 MED ORDER — METRONIDAZOLE 500 MG/100ML IV SOLN: 500.0000 mg | Freq: Two times a day (BID) | INTRAVENOUS | Status: DC

## 2024-11-19 MED ORDER — METRONIDAZOLE 500 MG/100ML IV SOLN
500.0000 mg | Freq: Two times a day (BID) | INTRAVENOUS | Status: AC
  Administered 2024-11-19: 500 mg via INTRAVENOUS
  Filled 2024-11-19 (×3): qty 100

## 2024-11-19 MED ORDER — ACETAMINOPHEN 500 MG PO TABS
1000.0000 mg | ORAL_TABLET | Freq: Four times a day (QID) | ORAL | Status: AC
  Administered 2024-11-19 (×2): 1000 mg via ORAL
  Filled 2024-11-19 (×2): qty 2

## 2024-11-19 MED ORDER — SODIUM CHLORIDE 0.9 % IV SOLN
INTRAVENOUS | Status: AC
  Filled 2024-11-19: qty 5

## 2024-11-19 MED ORDER — SIMETHICONE 80 MG PO CHEW: 80.0000 mg | CHEWABLE_TABLET | ORAL | Status: AC | PRN

## 2024-11-19 MED ORDER — SODIUM CHLORIDE 0.9 % IV SOLN
INTRAVENOUS | Status: DC | PRN
  Administered 2024-11-19: 500 mg via INTRAVENOUS

## 2024-11-19 MED ORDER — ENOXAPARIN SODIUM 60 MG/0.6ML IJ SOSY: 60.0000 mg | PREFILLED_SYRINGE | INTRAMUSCULAR | Status: AC

## 2024-11-19 MED ORDER — CEFAZOLIN SODIUM-DEXTROSE 3-4 GM/150ML-% IV SOLN
INTRAVENOUS | Status: AC
  Filled 2024-11-19: qty 150

## 2024-11-19 MED ORDER — NALOXONE HCL 4 MG/10ML IJ SOLN: 1.0000 ug/kg/h | INTRAVENOUS | Status: AC | PRN

## 2024-11-19 MED ORDER — FENTANYL CITRATE (PF) 100 MCG/2ML IJ SOLN
INTRAMUSCULAR | Status: AC
  Filled 2024-11-19: qty 2

## 2024-11-19 NOTE — Discharge Summary (Shared)
 "    Postpartum Discharge Summary  Date of Service updated***     Patient Name: Melanie Castillo DOB: 07/11/1981 MRN: [id number]  Date of admission: 11/18/2024 Delivery date:11/19/2024 Delivering provider: KANDIS ASA BEDFORD Date of discharge: 11/19/2024  Admitting diagnosis: Indication for care in labor or delivery [O75.9] Intrauterine pregnancy: [redacted]w[redacted]d     Secondary diagnosis:  Principal Problem:   Indication for care in labor or delivery Active Problems:   AMA (advanced maternal age) multigravida 35+   Maternal morbid obesity, antepartum (HCC)   Diet controlled gestational diabetes mellitus (GDM), antepartum   Arrest of dilation, delivered, current hospitalization  Additional problems: N/A    Discharge diagnosis: Term Pregnancy Delivered and GDM A1                                              Post partum procedures:{Postpartum procedures:23558} Augmentation: AROM, Pitocin , Cytotec , and IP Foley Complications: None  Hospital course: Induction of Labor With Cesarean Section   44 y.o. yo G5P5005 at [redacted]w[redacted]d was admitted to the hospital 11/18/2024 for induction of labor. Patient had a labor course significant for cervical edema leading to arrest of dilation. The patient went for cesarean section due to Arrest of Dilation. Delivery details are as follows: Membrane Rupture Time/Date: 9:56 PM,11/19/2024  Delivery Method:C-Section, Low Transverse Operative Delivery:N/A Details of operation can be found in separate operative Note.  Patient had a postpartum course complicated by***. She is ambulating, tolerating a regular diet, passing flatus, and urinating well.  Patient is discharged home in stable condition on 11/19/24.      Newborn Data: Birth date:11/19/2024 Birth time:8:19 AM Gender:Female Living status:Living Apgars:8 ,9  Weight:3510 g                               Magnesium Sulfate received: {Mag received:30440022} BMZ received: No Rhophylac:N/A MMR:N/A T-DaP:Given prenatally Flu:  No RSV Vaccine received: No Transfusion:{Transfusion received:30440034}  Immunizations received: Immunization History  Administered Date(s) Administered   DTP 03/31/1980, 05/31/1980, 08/18/1980, 01/11/1982, 02/02/1985   Hep A / Hep B 09/14/2008   Influenza,inj,Quad PF,6+ Mos 07/29/2016   Influenza,inj,quad, With Preservative 07/14/2016   Influenza-Unspecified 12/23/2007   MMR 06/29/1981, 01/18/2002   OPV 03/31/1980, 05/31/1980, 08/18/1980, 01/11/1982, 02/02/1985   Td (Adult),5 Lf Tetanus Toxid, Preservative Free 02/25/2008   Tdap 03/19/2017, 11/02/2024   Unspecified SARS-COV-2 Vaccination 12/23/2019, 01/20/2020    Physical exam  Vitals:   11/19/24 0610 11/19/24 0630 11/19/24 0645 11/19/24 0700  BP:  109/65  105/62  Pulse:  87  (!) 113  Resp:      Temp:   100.1 F (37.8 C)   TempSrc:   Axillary   SpO2: 100%     Weight:      Height:       General: {Exam; general:21111117} Lochia: {Desc; appropriate/inappropriate:30686::appropriate} Uterine Fundus: {Desc; firm/soft:30687} Incision: {Exam; incision:21111123} DVT Evaluation: {Exam; dvt:2111122} Labs: Lab Results  Component Value Date   WBC 8.5 11/18/2024   HGB 11.6 (L) 11/18/2024   HCT 35.5 (L) 11/18/2024   MCV 73.0 (L) 11/18/2024   PLT 177 11/18/2024      Latest Ref Rng & Units 09/08/2024   10:52 AM  CMP  Glucose 70 - 99 mg/dL 91   BUN 6 - 24 mg/dL 3   Creatinine 9.42 - 8.99 mg/dL 9.45  Sodium 134 - 144 mmol/L 135   Potassium 3.5 - 5.2 mmol/L 3.9   Chloride 96 - 106 mmol/L 103   CO2 20 - 29 mmol/L 17   Calcium 8.7 - 10.2 mg/dL 9.0   Total Protein 6.0 - 8.5 g/dL 7.0   Total Bilirubin 0.0 - 1.2 mg/dL 0.2   Alkaline Phos 41 - 116 IU/L 56   AST 0 - 40 IU/L 11   ALT 0 - 32 IU/L 7    Edinburgh Score:    05/14/2017    3:18 PM  Edinburgh Postnatal Depression Scale Screening Tool  I have been able to laugh and see the funny side of things. 1   I have looked forward with enjoyment to things. 1   I have blamed  myself unnecessarily when things went wrong. 3   I have been anxious or worried for no good reason. 2   I have felt scared or panicky for no good reason. 3   Things have been getting on top of me. 2   I have been so unhappy that I have had difficulty sleeping. 2   I have felt sad or miserable. 3   I have been so unhappy that I have been crying. 3   The thought of harming myself has occurred to me. 0   Edinburgh Postnatal Depression Scale Total 20      Data saved with a previous flowsheet row definition   No data recorded  After visit meds:  Allergies as of 11/19/2024       Reactions   Wasp Venom Anaphylaxis     Med Rec must be completed prior to using this [facility]***        Discharge home in stable condition Infant Feeding: {Baby feeding:23562} Infant Disposition:{CHL IP OB HOME WITH FNUYZM:76418} Discharge instruction: per After Visit Summary and Postpartum booklet. Activity: Advance as tolerated. Pelvic rest for 6 weeks.  Diet: {OB ipzu:[id number]} Future Appointments: Future Appointments  Date Time Provider Department Center  11/29/2024  2:30 PM [facility] PROVIDER 1 [facility]  11/29/2024  3:00 PM [facility]  1 [facility] [place]   Follow up Visit:   Please schedule this patient for a In person postpartum visit in 6 weeks with the following provider: Any provider. Additional Postpartum F/U:2 hour GTT and Incision check 1 week  High risk pregnancy complicated by: GDM Delivery mode:  C-Section, Low Transverse Anticipated Birth Control:  BTL done PP  Message sent to [facility] 2/6.  11/19/2024 Kabe Mckoy LITTIE Angles, MD    "

## 2024-11-19 NOTE — Discharge Summary (Shared)
 "    Postpartum Discharge Summary  Date of Service updated***     Patient Name: Melanie Castillo DOB: 22-Feb-1981 MRN: [id number]  Date of admission: 11/18/2024 Delivery date:  Delivering provider:   Date of discharge: 11/19/2024  Admitting diagnosis: Indication for care in labor or delivery [O75.9] Intrauterine pregnancy: [redacted]w[redacted]d     Secondary diagnosis:  Principal Problem:   Indication for care in labor or delivery  Additional problems: GDM, AMA    Discharge diagnosis: {DX.:23714}                                              Post partum procedures:{Postpartum procedures:23558} Augmentation: AROM, Pitocin , Cytotec , and IP Foley Complications: {OB Labor/Delivery Complications:20784}  Hospital course: {Courses:23701}  Magnesium Sulfate received: No BMZ received: No Rhophylac:N/A MMR:N/A T-DaP:Given prenatally Flu: No RSV Vaccine received: No Transfusion:{Transfusion received:30440034}  Immunizations received: Immunization History  Administered Date(s) Administered   DTP 03/31/1980, 05/31/1980, 08/18/1980, 01/11/1982, 02/02/1985   Hep A / Hep B 09/14/2008   Influenza,inj,Quad PF,6+ Mos 07/29/2016   Influenza,inj,quad, With Preservative 07/14/2016   Influenza-Unspecified 12/23/2007   MMR 06/29/1981, 01/18/2002   OPV 03/31/1980, 05/31/1980, 08/18/1980, 01/11/1982, 02/02/1985   Td (Adult),5 Lf Tetanus Toxid, Preservative Free 02/25/2008   Tdap 03/19/2017, 11/02/2024   Unspecified SARS-COV-2 Vaccination 12/23/2019, 01/20/2020    Physical exam  Vitals:   11/18/24 2334 11/18/24 2335 11/19/24 0000 11/19/24 0005  BP: 106/88  124/83   Pulse: 82  90   Resp:      Temp:      TempSrc:      SpO2: 99% 99% 99% 99%  Weight:      Height:       General: {Exam; general:21111117} Lochia: {Desc; appropriate/inappropriate:30686::appropriate} Uterine Fundus: {Desc; firm/soft:30687} Incision: {Exam; incision:21111123} DVT Evaluation: {Exam; dvt:2111122} Labs: Lab Results   Component Value Date   WBC 8.5 11/18/2024   HGB 11.6 (L) 11/18/2024   HCT 35.5 (L) 11/18/2024   MCV 73.0 (L) 11/18/2024   PLT 177 11/18/2024      Latest Ref Rng & Units 09/08/2024   10:52 AM  CMP  Glucose 70 - 99 mg/dL 91   BUN 6 - 24 mg/dL 3   Creatinine 9.42 - 8.99 mg/dL 9.45   Sodium 865 - 855 mmol/L 135   Potassium 3.5 - 5.2 mmol/L 3.9   Chloride 96 - 106 mmol/L 103   CO2 20 - 29 mmol/L 17   Calcium 8.7 - 10.2 mg/dL 9.0   Total Protein 6.0 - 8.5 g/dL 7.0   Total Bilirubin 0.0 - 1.2 mg/dL 0.2   Alkaline Phos 41 - 116 IU/L 56   AST 0 - 40 IU/L 11   ALT 0 - 32 IU/L 7    Edinburgh Score:    05/14/2017    3:18 PM  Edinburgh Postnatal Depression Scale Screening Tool  I have been able to laugh and see the funny side of things. 1   I have looked forward with enjoyment to things. 1   I have blamed myself unnecessarily when things went wrong. 3   I have been anxious or worried for no good reason. 2   I have felt scared or panicky for no good reason. 3   Things have been getting on top of me. 2   I have been so unhappy that I have had difficulty sleeping. 2   I  have felt sad or miserable. 3   I have been so unhappy that I have been crying. 3   The thought of harming myself has occurred to me. 0   Edinburgh Postnatal Depression Scale Total 20      Data saved with a previous flowsheet row definition   No data recorded  After visit meds:  Allergies as of 11/19/2024       Reactions   Wasp Venom Anaphylaxis     Med Rec must be completed prior to using this [facility]***        Discharge home in stable condition Infant Feeding: {Baby feeding:23562} Infant Disposition:{CHL IP OB HOME WITH FNUYZM:76418} Discharge instruction: per After Visit Summary and Postpartum booklet. Activity: Advance as tolerated. Pelvic rest for 6 weeks.  Diet: {OB ipzu:[id number]} Future Appointments: Future Appointments  Date Time Provider Department Center  11/29/2024  2:30 PM [facility]  PROVIDER 1 [facility]  11/29/2024  3:00 PM [facility]  1 [facility] [place]   Follow up Visit:   Please schedule this patient for a In person postpartum visit in 4 weeks with the following provider:  any provider Additional Postpartum F/U:2 hour GTT, colpo Low risk pregnancy complicated by: GDM Delivery mode:    Anticipated Birth Control: Mirena  placed *** 11/19/2024 Cathlean Ely, CNM    "

## 2024-11-19 NOTE — Progress Notes (Signed)
 Patient Vitals for the past 4 hrs:  BP Pulse SpO2  11/19/24 0005 -- -- 99 %  11/19/24 0000 124/83 90 99 %  11/18/24 2335 -- -- 99 %  11/18/24 2334 106/88 82 99 %  11/18/24 2330 106/88 82 98 %  11/18/24 2217 -- -- 98 %  11/18/24 2215 -- -- 98 %  11/18/24 2210 -- -- 99 %  11/18/24 2200 127/70 85 97 %   Comfortable w/epidural.  Has intermittent pressure . FHR Cat 1, ctx q 2-3 minutes, pitocin  at 6 mu/min.  Cx was 6.5/80/-1 per RN exam about an hour ago; now, however, the cervix is quite edematous from 11 o'clock all the way around to 7 o'clock, therefore dilation feels more like 5-6 cms, vtx asynclitic.  Pt had a dose of Vistaril  about 40 minutes ago, so reluctant to give IV benadryl .  Will place in exaggerated positions to try to facilitate rotation.

## 2024-11-19 NOTE — Progress Notes (Signed)
 Due to patient strip, per Dr. Jomarie & Dr. Izell, do not go up on the pitocin .   Baby had a brief time from 05:37 to 05:45 that she had a few late decels however, after fluid bolus, baby looks much improved.    Tiahna Cure, BSN, RN

## 2024-11-19 NOTE — Op Note (Signed)
 Cesarean Section Operative Report  Melanie Castillo  11/18/2024 - 11/19/2024  Indications: failed induction, maternal request, undesired fertility   Pre-operative Diagnosis: primary low transverse cesarean section, bilateral tubal ligation (salpingectomy on the left, modified pomeroy on the right)  Post-operative Diagnosis: Same   Surgeon: Surgeons and Role:    * Tatiana Courter, Devaughn Sayres, MD - Primary     * Hulda Angles , MD - assisting  Attending Attestation: I was present and scrubbed for the entire procedure.   An experienced assistant was required given the standard of surgical care given the complexity of the case.  This assistant was needed for exposure, dissection, suctioning, retraction, instrument exchange, assisting with delivery with administration of fundal pressure, and for overall help during the procedure.  Anesthesia: epidural    Estimated Blood Loss: 400 ml  Total IV Fluids: 1500 ml LR  Urine Output:: 100 ml clear yellow urine  Specimens: fallopian tubes to pathology  Findings: Viable female infant in cephalic presentation; Apgars pending; weight pending; arterial cord pH not obtained;  clear amniotic fluid; intact placenta with three vessel cord; normal uterus, fallopian tubes and ovaries bilaterally.  Baby condition / location:  Couplet care / Skin to Skin   Complications: no complications  Indications: Melanie Castillo is a 44 y.o. H4E4994 with an IUP [redacted]w[redacted]d presenting for IOL for [name]. Failed to progress and requested cesarean.  The risks, benefits, complications, treatment options, and exected outcomes were discussed with the patient . The patient dwith the proposed plan, giving informed consent. identified as Melanie Castillo and the procedure verified as C-Section Delivery.  Procedure Details:  The patient was taken back to the operative suite where epidural anesthesia was dosed.  A time out was held and the above information confirmed.   After  induction of anesthesia, the patient was draped and prepped in the usual sterile manner and placed in a dorsal supine position with a leftward tilt. A Pfannenstiel incision was made and carried down through the subcutaneous tissue to the fascia. Fascial incision was made and bluntly extended transversely. The fascia was separated from the underlying rectus tissue superiorly and inferiorly. The peritoneum was identified and bluntly entered and extended longitudinally. Alexis retractor was placed. A bladder flap was not created. A low transverse uterine incision was made and extended bluntly. Delivered from cephalic presentation was a viable infant with Apgars and weight as above.  After waiting 60 seconds for delayed cord cutting, the umbilical cord was clamped and cut cord blood was obtained for evaluation. Cord ph was not sent. The placenta was removed Intact and appeared normal. The uterine outline, tubes and ovaries appeared normal. The uterine incision was closed with running unlocked sutures 0-Vicryl in one layer with one figure of eight 0 vicryl at the right hysterotomy margin.   Hemostasis was observed.   Attention was then turned to the left fallopian tube. Kelly forceps were placed on the mesosalpinx underneath most of the tube.  This pedicle was double suture ligated with 2-0 plain gut, and the tube including the fimbriated end was excised.  The right fallopian tube was then identified. The fimbria were adhered to the bulk of the ovary precluding performing safe salpingectomy so the tube was grasped and a tube was grasped and double suture ligated and portion resected in the modified Pomeroy method.  Good hemostasis was noted overall.  The peritoneum was closed with 2-0 vicryl. The rectus muscles were examined and hemostasis observed. The fascia was then reapproximated with  running sutures of 0-Vicryl. The subcuticular closure was performed with 2-0 plain gut. The skin was closed with 4-0  Vicryl.  Instrument, sponge, and needle counts were correct prior the abdominal closure and were correct at the conclusion of the case.     Disposition: PACU - hemodynamically stable.   Maternal Condition: stable       Signed: Devaughn KATHEE Ban, MD 11/19/2024 9:10 AM

## 2024-11-19 NOTE — Progress Notes (Signed)
 Patient ID: Melanie Castillo, female   DOB: 1981-02-19, 44 y.o.   MRN: [id number]  Blood pressure 105/62, pulse (!) 113, temperature 100.1 F (37.8 C), temperature source Axillary, resp. rate 16, height 5' 4.5 (1.638 m), weight 119.4 kg, SpO2 100%, currently breastfeeding.  FHT: baseline 150/moderate variability/+accels/occasional late decels Toco: ctx q2-91min  Dilation: 5.5 Effacement (%): 60 Station: +2 Presentation: Vertex Exam by:: Dr. Jomarie  Seen at bedside to discuss plan of care. SVE continues to be unchanged in dilation with progress in station. Cervical swelling present. Patient is frustrated and requesting to talk through other options for delivery. Feeling unwell and vomiting throughout conversation. Discussed 1LTCS and process to get to the OR per patient request. All questions answered. Would like to proceed with C/S. Pitocin  turned off and Dr. Izell called.   Charlie DELENA Jomarie, MD

## 2024-11-19 NOTE — Anesthesia Postprocedure Evaluation (Signed)
"   Anesthesia Post Note  Patient: Melanie Castillo  Procedure(s) Performed: CESAREAN SECTION, WITH BILATERAL TUBAL LIGATION (Abdomen)     Patient location during evaluation: PACU Anesthesia Type: Epidural Level of consciousness: awake and alert Pain management: pain level controlled Vital Signs Assessment: post-procedure vital signs reviewed and stable Respiratory status: spontaneous breathing, nonlabored ventilation and respiratory function stable Cardiovascular status: blood pressure returned to baseline Postop Assessment: epidural receding, no apparent nausea or vomiting, no headache and no backache Anesthetic complications: no   No notable events documented.  Last Vitals:  Vitals:   11/19/24 1025 11/19/24 1030  BP:  125/73  Pulse: (!) 101 92  Resp: (!) 26 (!) 30  Temp:    SpO2: 98% 98%    Last Pain:  Vitals:   11/19/24 1025  TempSrc:   PainSc: 0-No pain   Pain Goal:    LLE Motor Response: Purposeful movement, Responds to commands (11/19/24 1025) LLE Sensation: Numbness (11/19/24 1025) RLE Motor Response: Purposeful movement, Responds to commands (11/19/24 1025) RLE Sensation: Numbness (11/19/24 1025)     Epidural/Spinal Function Cutaneous sensation: Able to Wiggle Toes (11/19/24 1025), Patient able to flex knees: Yes (11/19/24 1025), Patient able to lift hips off bed: No (11/19/24 1025), Back pain beyond tenderness at insertion site: No (11/19/24 1025), Progressively worsening motor and/or sensory loss: No (11/19/24 1025), Bowel and/or bladder incontinence post epidural: No (11/19/24 1025)  [name]      "

## 2024-11-19 NOTE — Lactation Note (Signed)
 This note was copied from a baby's chart. Lactation Consultation Note  Patient Name: Melanie Castillo Unijb'd Date: 11/19/2024 Age:45 hours   Mom chooses to formula feed.  Maternal Data    Feeding    LATCH Score                    Lactation Tools Discussed/Used    Interventions    Discharge    Consult Status Consult Status: Complete    Raeden Schippers G 11/19/2024, 8:04 PM

## 2024-11-19 NOTE — Progress Notes (Signed)
 Patient ID: Melanie Castillo, female   DOB: Jun 08, 1981, 44 y.o.   MRN: [id number]  Blood pressure 119/76, pulse 88, temperature 98.2 F (36.8 C), temperature source Oral, resp. rate 16, height 5' 4.5 (1.638 m), weight 119.4 kg, SpO2 99%, currently breastfeeding.  FHT: baseline 120/moderate variability/+accels/-decels Toco: ctx q1min  Dilation: 5 Effacement (%): 80 Station: -1 Presentation: Vertex Exam by:: Greig Meyers, RN  Seen at bedside to meet patient. SVE completed, dilation unchanged however swelling is starting to improve. Has been given vistaril  and benadryl  and position changes. Patient feeling intense pressure with contractions. Epidural in place for pain management. POCT glucose 97 at 255am. Anticipate SVD.  Charlie DELENA Courts, MD

## 2024-11-19 NOTE — Transfer of Care (Signed)
 Immediate Anesthesia Transfer of Care Note  Patient: Melanie Castillo  Procedure(s) Performed: CESAREAN SECTION, WITH BILATERAL TUBAL LIGATION (Abdomen)  Patient Location: PACU  Anesthesia Type:Epidural  Level of Consciousness: awake, alert , and oriented  Airway & Oxygen Therapy: Patient Spontanous Breathing  Post-op Assessment: Report given to RN and Post -op Vital signs reviewed and stable  Post vital signs: Reviewed and stable  Last Vitals:  Vitals Value Taken Time  BP 113/80 11/19/24 09:30  Temp    Pulse 105 11/19/24 09:39  Resp 28 11/19/24 09:39  SpO2 98 % 11/19/24 09:39  Vitals shown include unfiled device data.  Last Pain:  Vitals:   11/19/24 0925  TempSrc: Oral  PainSc: 0-No pain         Complications: No notable events documented.

## 2024-11-19 NOTE — Progress Notes (Signed)
 Fever 100.5 after surgery, given duration of rom presume this to be chorio, will start amp/gent/flagyl  plan for minimum 48 hours

## 2024-11-19 NOTE — Progress Notes (Signed)
 L&D Note  11/19/2024 - 7:33 AM  43 y.o. [id number] [redacted]w[redacted]d. Pregnancy complicated by:   Patient Active Problem List   Diagnosis Date Noted   Indication for care in labor or delivery 11/18/2024   Abnormal Pap smear of cervix 11/04/2024   Diet controlled gestational diabetes mellitus (GDM), antepartum 10/13/2024   Pregnancy with adoption planned, antepartum 10/13/2024   Alpha thalassemia silent carrier 09/22/2024   Family history of Down syndrome 09/02/2024   Supervision of high risk pregnancy, antepartum 09/02/2024   GAD (generalized anxiety disorder) 04/22/2019   AMA (advanced maternal age) multigravida 35+ 11/27/2016   Maternal morbid obesity, antepartum (HCC) 10/17/2016   Ms. Shemeka L Batte is admitted for IOL for uncontrolled GDM   Subjective:  Patient comfortable with epidural; pt desiring c-section   Objective:   Vitals:   11/19/24 0610 11/19/24 0630 11/19/24 0645 11/19/24 0700  BP:  109/65  105/62  Pulse:  87  (!) 113  Resp:      Temp:   100.1 F (37.8 C)   TempSrc:   Axillary   SpO2: 100%     Weight:      Height:        Current Vital Signs 24h Vital Sign Ranges  T 100.1 F (37.8 C) Temp  Avg: 99 F (37.2 C)  Min: 98.2 F (36.8 C)  Max: 100.1 F (37.8 C)  BP 105/62 BP  Min: 105/62  Max: 145/105  HR (!) 113 Pulse  Avg: 91.3  Min: 82  Max: 113  RR 16 Resp  Avg: 15.5  Min: 14  Max: 16  SaO2 100 %   SpO2  Avg: 98.7 %  Min: 97 %  Max: 100 %       24 Hour I/O Current Shift I/O  Time Ins Outs No intake/output data recorded. No intake/output data recorded.   FHR: 150 baseline, +accels, no decel, mod variability Toco: q3-78m MVUs approx 120 Gen: NAD SVE: deferred but unchanged with swollen cervix at 5-6cm   Labs:  Recent Labs  Lab 11/18/24 1138  WBC 8.5  HGB 11.6*  HCT 35.5*  PLT 177   No results for input(s): NA, K, CL, CO2, BUN, CREATININE, CALCIUM, PROT, BILITOT, ALKPHOS, ALT, AST, GLUCOSE in the last 168 hours.  Invalid  input(s): LABALBU  Medications Current Facility-Administered Medications  Medication Dose Route Frequency Provider Last Rate Last Admin   acetaminophen  (TYLENOL ) tablet 650 mg  650 mg Oral Q4H PRN Cleatus Moccasin, MD       azithromycin  (ZITHROMAX ) tablet 1,000 mg  1,000 mg Oral Once Izell Harari, MD       ceFAZolin  (ANCEF ) IVPB 2g/100 mL premix  2 g Intravenous 30 min Pre-Op Izell Harari, MD       diphenhydrAMINE  (BENADRYL ) injection 12.5 mg  12.5 mg Intravenous Q15 min PRN Jerrye Sharper, MD       ePHEDrine  injection 10 mg  10 mg Intravenous PRN Jerrye Sharper, MD       ePHEDrine  injection 10 mg  10 mg Intravenous PRN Jerrye Sharper, MD       fentaNYL  (SUBLIMAZE ) injection 50-100 mcg  50-100 mcg Intravenous Q1H PRN Cleatus Moccasin, MD   100 mcg at 11/18/24 1747   fentaNYL  2 mcg/mL w/ bupivacaine  0.125% in NS 250 mL epidural infusion  12 mL/hr Epidural Continuous PRN Jerrye Sharper, MD 12 mL/hr at 11/18/24 2057 12 mL/hr at 11/18/24 2057   hydrOXYzine  (ATARAX ) tablet 50 mg  50 mg Oral TID PRN Regino Camie LABOR, CNM  50 mg at 11/19/24 0024   lactated ringers  infusion 500-1,000 mL  500-1,000 mL Intravenous PRN Cleatus Moccasin, MD       lactated ringers  infusion   Intravenous Continuous Cleatus Moccasin, MD 125 mL/hr at 11/18/24 1855 New Bag at 11/18/24 1855   levonorgestrel  (MIRENA ) 20 MCG/DAY IUD 1 each  1 each Intrauterine Once Landon Garen SQUIBB, MD       lidocaine  (PF) (XYLOCAINE ) 1 % injection 30 mL  30 mL Subcutaneous PRN Cleatus Moccasin, MD       metoCLOPramide  (REGLAN ) injection 10 mg  10 mg Intravenous Once Shabazz, Tekiyah A, MD       misoprostol  (CYTOTEC ) tablet 50 mcg  50 mcg Oral Q4H PRN Regino Camie LABOR, CNM   50 mcg at 11/18/24 1250   ondansetron  (ZOFRAN ) injection 4 mg  4 mg Intravenous Q6H PRN Cleatus Moccasin, MD   4 mg at 11/19/24 9643   oxytocin  (PITOCIN ) IV BOLUS FROM BAG  333 mL Intravenous Once Cleatus Moccasin, MD       Followed by   oxytocin  (PITOCIN ) IV infusion 30  units in NS 500 mL - Premix  2.5 Units/hr Intravenous Continuous Cleatus Moccasin, MD       oxytocin  (PITOCIN ) IV infusion 30 units in NS 500 mL - Premix  1-40 milli-units/min Intravenous Titrated Cresenzo-Dishmon, Cathlean, CNM   Stopped at 11/19/24 0700   PHENYLephrine  80 mcg/ml in normal saline Adult IV Push Syringe (For Blood Pressure Support)  80-160 mcg Intravenous PRN Jerrye Sharper, MD       PHENYLephrine  80 mcg/ml in normal saline Adult IV Push Syringe (For Blood Pressure Support)  80 mcg Intravenous PRN Jerrye Sharper, MD       sodium citrate-citric acid  (ORACIT) solution 30 mL  30 mL Oral Q2H PRN Cleatus Moccasin, MD       sodium citrate-citric acid  (ORACIT) solution 30 mL  30 mL Oral 30 min Pre-Op Izell Harari, MD       terbutaline  (BRETHINE ) injection 0.25 mg  0.25 mg Subcutaneous Once PRN Regino Camie LABOR, CNM       terbutaline  (BRETHINE ) injection 0.25 mg  0.25 mg Subcutaneous Once PRN Cresenzo-Dishmon, Cathlean, CNM       tranexamic acid  (CYKLOKAPRON ) IVPB 1,000 mg  1,000 mg Intravenous To OR Cresenzo-Dishmon, Cathlean, CNM       tranexamic acid  (CYKLOKAPRON ) IVPB 1,000 mg  1,000 mg Intravenous Once Izell Harari, MD       Facility-Administered Medications Ordered in Other Encounters  Medication Dose Route Frequency Provider Last Rate Last Admin   lidocaine  (PF) (XYLOCAINE ) 1 % injection   Epidural Anesthesia Intra-op Jerrye Sharper, MD   5 mL at 11/18/24 2054   Assessment & Plan:  Patient stable *Labor: d/w her that unsure about labor dystocia but concern for asynclitic presentation. She has been ruptured since about 2100 and unchanged cervix, on pitocin  the entire time, since midnight. I told her that a c-section for arrest of dilation is very reasonable. Risk of c/s d/w her and she would like to proceed. I also d/w her r/b/a of BTL and she would like a BTL. Ancef  and azithro ordered *GDM: CBGs 80s-90s *GBS: neg *Analgesia: epidural working well  Harari Izell Raddle.  MD [facility]ending [facility] [employer] [facility])

## 2024-11-24 ENCOUNTER — Ambulatory Visit

## 2024-11-25 ENCOUNTER — Ambulatory Visit: Payer: Self-pay

## 2024-11-29 ENCOUNTER — Ambulatory Visit

## 2024-12-22 ENCOUNTER — Other Ambulatory Visit: Payer: Self-pay

## 2024-12-22 ENCOUNTER — Ambulatory Visit: Payer: Self-pay | Admitting: Family Medicine
